# Patient Record
Sex: Female | Born: 1990 | Race: White | Hispanic: No | Marital: Single | Smoking: Never smoker
Health system: Southern US, Community
[De-identification: ages and names within clinical notes are randomized; demographics above are authoritative.]

## PROBLEM LIST (undated history)

## (undated) ENCOUNTER — Inpatient Hospital Stay (HOSPITAL_COMMUNITY): Payer: Self-pay

## (undated) DIAGNOSIS — F419 Anxiety disorder, unspecified: Secondary | ICD-10-CM

## (undated) DIAGNOSIS — N200 Calculus of kidney: Secondary | ICD-10-CM

## (undated) DIAGNOSIS — O139 Gestational [pregnancy-induced] hypertension without significant proteinuria, unspecified trimester: Secondary | ICD-10-CM

## (undated) DIAGNOSIS — D649 Anemia, unspecified: Secondary | ICD-10-CM

## (undated) DIAGNOSIS — N390 Urinary tract infection, site not specified: Secondary | ICD-10-CM

## (undated) HISTORY — DX: Urinary tract infection, site not specified: N39.0

---
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
## 2012-12-13 NOTE — L&D Delivery Note (Signed)
Delivery Note At 6:44 PM a viable female was delivered via Vaginal, Spontaneous Delivery (Presentation: ; Occiput Anterior).  APGAR: 6, 9; weight 9 lb 6.4 oz (4265 g).   Placenta status: Intact, Spontaneous.  Cord: 3 vessels with the following complications: None.     Anesthesia: Epidural  Episiotomy: None Lacerations: 2nd degree;Perineal Suture Repair: 3.0 vicryl Est. Blood Loss (mL): 300  Mom to postpartum.  Baby to nursery-stable.  Pt c/c/+2 and pushed to deliver a liveborn female via NSVD. Shoulder dystocia complicating delivery for <30 seconds that resolved with McRoberts and Suprapubic pressure. Spontaneous cry with toweling.  Placenta delivered spontaneously intact.  2nd degree perineal lac repaired without difficulty. Mom and baby stable.   Caleigha Zale L 08/10/2013, 8:11 PM

## 2013-01-09 LAB — OB RESULTS CONSOLE HIV ANTIBODY (ROUTINE TESTING): HIV: NONREACTIVE

## 2013-01-09 LAB — OB RESULTS CONSOLE GC/CHLAMYDIA
Chlamydia: NEGATIVE
Gonorrhea: NEGATIVE

## 2013-01-09 LAB — OB RESULTS CONSOLE RUBELLA ANTIBODY, IGM: Rubella: IMMUNE

## 2013-03-07 ENCOUNTER — Ambulatory Visit (INDEPENDENT_AMBULATORY_CARE_PROVIDER_SITE_OTHER): Payer: BC Managed Care – PPO | Admitting: Obstetrics and Gynecology

## 2013-03-07 ENCOUNTER — Encounter: Payer: Self-pay | Admitting: *Deleted

## 2013-03-07 ENCOUNTER — Encounter: Payer: Self-pay | Admitting: Obstetrics and Gynecology

## 2013-03-07 VITALS — BP 119/70 | Ht 69.5 in | Wt 181.0 lb

## 2013-03-07 DIAGNOSIS — O26899 Other specified pregnancy related conditions, unspecified trimester: Secondary | ICD-10-CM | POA: Insufficient documentation

## 2013-03-07 DIAGNOSIS — Z3400 Encounter for supervision of normal first pregnancy, unspecified trimester: Secondary | ICD-10-CM

## 2013-03-07 DIAGNOSIS — O360121 Maternal care for anti-D [Rh] antibodies, second trimester, fetus 1: Secondary | ICD-10-CM

## 2013-03-07 DIAGNOSIS — Z3402 Encounter for supervision of normal first pregnancy, second trimester: Secondary | ICD-10-CM | POA: Insufficient documentation

## 2013-03-07 DIAGNOSIS — O360990 Maternal care for other rhesus isoimmunization, unspecified trimester, not applicable or unspecified: Secondary | ICD-10-CM

## 2013-03-07 DIAGNOSIS — Z124 Encounter for screening for malignant neoplasm of cervix: Secondary | ICD-10-CM

## 2013-03-07 DIAGNOSIS — Z1151 Encounter for screening for human papillomavirus (HPV): Secondary | ICD-10-CM

## 2013-03-07 DIAGNOSIS — O2340 Unspecified infection of urinary tract in pregnancy, unspecified trimester: Secondary | ICD-10-CM | POA: Insufficient documentation

## 2013-03-07 NOTE — Addendum Note (Signed)
Addended by: Arne Cleveland on: 03/07/2013 04:59 PM   Modules accepted: Orders

## 2013-03-07 NOTE — Progress Notes (Signed)
P= 86  

## 2013-03-07 NOTE — Progress Notes (Signed)
Had [facility] in [place]. Records reviewed. Dating confirmed by early scan. Problem list, past medical history, Ob/Gyn history, surgical history, family history and social history reviewed and updated as appropriate. Discussed genetic counseling> declines.  Pap done. Fundus 2/3 to U. Discussed routines.

## 2013-03-07 NOTE — Patient Instructions (Signed)
Pregnancy - Second Trimester The second trimester of pregnancy (3 to 6 months) is a period of rapid growth for you and your baby. At the end of the sixth month, your baby is about 9 inches long and weighs 1 1/2 pounds. You will begin to feel the baby move between 18 and 20 weeks of the pregnancy. This is called quickening. Weight gain is faster. A clear fluid (colostrum) may leak out of your breasts. You may feel small contractions of the womb (uterus). This is known as false labor or Braxton-Hicks contractions. This is like a practice for labor when the baby is ready to be born. Usually, the problems with morning sickness have usually passed by the end of your first trimester. Some women develop small dark blotches (called cholasma, mask of pregnancy) on their face that usually goes away after the baby is born. Exposure to the sun makes the blotches worse. Acne may also develop in some pregnant women and pregnant women who have acne, may find that it goes away. PRENATAL EXAMS  Blood work may continue to be done during prenatal exams. These tests are done to check on your health and the probable health of your baby. Blood work is used to follow your blood levels (hemoglobin). Anemia (low hemoglobin) is common during pregnancy. Iron and vitamins are given to help prevent this. You will also be checked for diabetes between 24 and 28 weeks of the pregnancy. Some of the previous blood tests may be repeated.  The size of the uterus is measured during each visit. This is to make sure that the baby is continuing to grow properly according to the dates of the pregnancy.  Your blood pressure is checked every prenatal visit. This is to make sure you are not getting toxemia.  Your urine is checked to make sure you do not have an infection, diabetes or protein in the urine.  Your weight is checked often to make sure gains are happening at the suggested rate. This is to ensure that both you and your baby are growing  normally.  Sometimes, an ultrasound is performed to confirm the proper growth and development of the baby. This is a test which bounces harmless sound waves off the baby so your caregiver can more accurately determine due dates. Sometimes, a specialized test is done on the amniotic fluid surrounding the baby. This test is called an amniocentesis. The amniotic fluid is obtained by sticking a needle into the belly (abdomen). This is done to check the chromosomes in instances where there is a concern about possible genetic problems with the baby. It is also sometimes done near the end of pregnancy if an early delivery is required. In this case, it is done to help make sure the baby's lungs are mature enough for the baby to live outside of the womb. CHANGES OCCURING IN THE SECOND TRIMESTER OF PREGNANCY Your body goes through many changes during pregnancy. They vary from person to person. Talk to your caregiver about changes you notice that you are concerned about.  During the second trimester, you will likely have an increase in your appetite. It is normal to have cravings for certain foods. This varies from person to person and pregnancy to pregnancy.  Your lower abdomen will begin to bulge.  You may have to urinate more often because the uterus and baby are pressing on your bladder. It is also common to get more bladder infections during pregnancy (pain with urination). You can help this by   drinking lots of fluids and emptying your bladder before and after intercourse.  You may begin to get stretch marks on your hips, abdomen, and breasts. These are normal changes in the body during pregnancy. There are no exercises or medications to take that prevent this change.  You may begin to develop swollen and bulging veins (varicose veins) in your legs. Wearing support hose, elevating your feet for 15 minutes, 3 to 4 times a day and limiting salt in your diet helps lessen the problem.  Heartburn may develop  as the uterus grows and pushes up against the stomach. Antacids recommended by your caregiver helps with this problem. Also, eating smaller meals 4 to 5 times a day helps.  Constipation can be treated with a stool softener or adding bulk to your diet. Drinking lots of fluids, vegetables, fruits, and whole grains are helpful.  Exercising is also helpful. If you have been very active up until your pregnancy, most of these activities can be continued during your pregnancy. If you have been less active, it is helpful to start an exercise program such as walking.  Hemorrhoids (varicose veins in the rectum) may develop at the end of the second trimester. Warm sitz baths and hemorrhoid cream recommended by your caregiver helps hemorrhoid problems.  Backaches may develop during this time of your pregnancy. Avoid heavy lifting, wear low heal shoes and practice good posture to help with backache problems.  Some pregnant women develop tingling and numbness of their hand and fingers because of swelling and tightening of ligaments in the wrist (carpel tunnel syndrome). This goes away after the baby is born.  As your breasts enlarge, you may have to get a bigger bra. Get a comfortable, cotton, support bra. Do not get a nursing bra until the last month of the pregnancy if you will be nursing the baby.  You may get a dark line from your belly button to the pubic area called the linea nigra.  You may develop rosy cheeks because of increase blood flow to the face.  You may develop spider looking lines of the face, neck, arms and chest. These go away after the baby is born. HOME CARE INSTRUCTIONS   It is extremely important to avoid all smoking, herbs, alcohol, and unprescribed drugs during your pregnancy. These chemicals affect the formation and growth of the baby. Avoid these chemicals throughout the pregnancy to ensure the delivery of a healthy infant.  Most of your home care instructions are the same as  suggested for the first trimester of your pregnancy. Keep your caregiver's appointments. Follow your caregiver's instructions regarding medication use, exercise and diet.  During pregnancy, you are providing food for you and your baby. Continue to eat regular, well-balanced meals. Choose foods such as meat, fish, milk and other low fat dairy products, vegetables, fruits, and whole-grain breads and cereals. Your caregiver will tell you of the ideal weight gain.  A physical sexual relationship may be continued up until near the end of pregnancy if there are no other problems. Problems could include early (premature) leaking of amniotic fluid from the membranes, vaginal bleeding, abdominal pain, or other medical or pregnancy problems.  Exercise regularly if there are no restrictions. Check with your caregiver if you are unsure of the safety of some of your exercises. The greatest weight gain will occur in the last 2 trimesters of pregnancy. Exercise will help you:  Control your weight.  Get you in shape for labor and delivery.  Lose weight   after you have the baby.  Wear a good support or jogging bra for breast tenderness during pregnancy. This may help if worn during sleep. Pads or tissues may be used in the bra if you are leaking colostrum.  Do not use hot tubs, steam rooms or saunas throughout the pregnancy.  Wear your seat belt at all times when driving. This protects you and your baby if you are in an accident.  Avoid raw meat, uncooked cheese, cat litter boxes and soil used by cats. These carry germs that can cause birth defects in the baby.  The second trimester is also a good time to visit your dentist for your dental health if this has not been done yet. Getting your teeth cleaned is OK. Use a soft toothbrush. Brush gently during pregnancy.  It is easier to loose urine during pregnancy. Tightening up and strengthening the pelvic muscles will help with this problem. Practice stopping your  urination while you are going to the bathroom. These are the same muscles you need to strengthen. It is also the muscles you would use as if you were trying to stop from passing gas. You can practice tightening these muscles up 10 times a set and repeating this about 3 times per day. Once you know what muscles to tighten up, do not perform these exercises during urination. It is more likely to contribute to an infection by backing up the urine.  Ask for help if you have financial, counseling or nutritional needs during pregnancy. Your caregiver will be able to offer counseling for these needs as well as refer you for other special needs.  Your skin may become oily. If so, wash your face with mild soap, use non-greasy moisturizer and oil or cream based makeup. MEDICATIONS AND DRUG USE IN PREGNANCY  Take prenatal vitamins as directed. The vitamin should contain 1 milligram of folic acid. Keep all vitamins out of reach of children. Only a couple vitamins or tablets containing iron may be fatal to a baby or young child when ingested.  Avoid use of all medications, including herbs, over-the-counter medications, not prescribed or suggested by your caregiver. Only take over-the-counter or prescription medicines for pain, discomfort, or fever as directed by your caregiver. Do not use aspirin.  Let your caregiver also know about herbs you may be using.  Alcohol is related to a number of birth defects. This includes fetal alcohol syndrome. All alcohol, in any form, should be avoided completely. Smoking will cause low birth rate and premature babies.  Street or illegal drugs are very harmful to the baby. They are absolutely forbidden. A baby born to an addicted mother will be addicted at birth. The baby will go through the same withdrawal an adult does. SEEK MEDICAL CARE IF:  You have any concerns or worries during your pregnancy. It is better to call with your questions if you feel they cannot wait, rather  than worry about them. SEEK IMMEDIATE MEDICAL CARE IF:   An unexplained oral temperature above 102 F (38.9 C) develops, or as your caregiver suggests.  You have leaking of fluid from the vagina (birth canal). If leaking membranes are suspected, take your temperature and tell your caregiver of this when you call.  There is vaginal spotting, bleeding, or passing clots. Tell your caregiver of the amount and how many pads are used. Light spotting in pregnancy is common, especially following intercourse.  You develop a bad smelling vaginal discharge with a change in the color from clear   to white.  You continue to feel sick to your stomach (nauseated) and have no relief from remedies suggested. You vomit blood or coffee ground-like materials.  You lose more than 2 pounds of weight or gain more than 2 pounds of weight over 1 week, or as suggested by your caregiver.  You notice swelling of your face, hands, feet, or legs.  You get exposed to German measles and have never had them.  You are exposed to fifth disease or chickenpox.  You develop belly (abdominal) pain. Round ligament discomfort is a common non-cancerous (benign) cause of abdominal pain in pregnancy. Your caregiver still must evaluate you.  You develop a bad headache that does not go away.  You develop fever, diarrhea, pain with urination, or shortness of breath.  You develop visual problems, blurry, or double vision.  You fall or are in a car accident or any kind of trauma.  There is mental or physical violence at home. Document Released: 11/23/2001 Document Revised: 02/21/2012 Document Reviewed: 05/28/2009 [employer] Patient Information 2013 [employer].  

## 2013-03-30 ENCOUNTER — Ambulatory Visit (HOSPITAL_COMMUNITY)
Admission: RE | Admit: 2013-03-30 | Discharge: 2013-03-30 | Disposition: A | Discharge status: Home / Self Care | Payer: BC Managed Care – PPO | Source: Ambulatory Visit | Attending: Obstetrics and Gynecology | Admitting: Obstetrics and Gynecology

## 2013-03-30 DIAGNOSIS — Z3402 Encounter for supervision of normal first pregnancy, second trimester: Secondary | ICD-10-CM

## 2013-03-30 DIAGNOSIS — Z3689 Encounter for other specified antenatal screening: Secondary | ICD-10-CM | POA: Insufficient documentation

## 2013-03-30 DIAGNOSIS — O360121 Maternal care for anti-D [Rh] antibodies, second trimester, fetus 1: Secondary | ICD-10-CM

## 2013-04-01 DIAGNOSIS — O4440 Low lying placenta NOS or without hemorrhage, unspecified trimester: Secondary | ICD-10-CM | POA: Insufficient documentation

## 2013-04-02 ENCOUNTER — Encounter: Payer: Self-pay | Admitting: Advanced Practice Midwife

## 2013-04-09 ENCOUNTER — Telehealth: Payer: Self-pay | Admitting: *Deleted

## 2013-04-09 ENCOUNTER — Encounter: Payer: Self-pay | Admitting: Advanced Practice Midwife

## 2013-04-09 NOTE — Telephone Encounter (Signed)
Lm on voicemail to try heat and tylenol for her back, drink plenty of fluids for the dizziness as this could be a sign of dehydration.  Offered to see pt tomorrow if she wanted to be seen before Wed, when she has an appt scheduled.  Explained that we could not call anything in without seeing her as we don't know what we are treating.

## 2013-04-11 ENCOUNTER — Ambulatory Visit (INDEPENDENT_AMBULATORY_CARE_PROVIDER_SITE_OTHER): Payer: BC Managed Care – PPO | Admitting: Advanced Practice Midwife

## 2013-04-11 VITALS — BP 126/72 | Wt 193.0 lb

## 2013-04-11 DIAGNOSIS — Z3402 Encounter for supervision of normal first pregnancy, second trimester: Secondary | ICD-10-CM

## 2013-04-11 DIAGNOSIS — Z3400 Encounter for supervision of normal first pregnancy, unspecified trimester: Secondary | ICD-10-CM

## 2013-04-11 DIAGNOSIS — R300 Dysuria: Secondary | ICD-10-CM

## 2013-04-11 LAB — POCT URINALYSIS DIPSTICK
Bilirubin, UA: NEGATIVE
Ketones, UA: NEGATIVE
pH, UA: 5

## 2013-04-11 MED ORDER — CYCLOBENZAPRINE HCL 10 MG PO TABS: 10.0000 mg | ORAL_TABLET | Freq: Three times a day (TID) | ORAL | Status: DC | PRN

## 2013-04-11 MED ORDER — CEPHALEXIN 500 MG PO CAPS: 500.0000 mg | ORAL_CAPSULE | Freq: Three times a day (TID) | ORAL | Status: DC

## 2013-04-11 NOTE — Progress Notes (Signed)
P - 104 - Pt states her lower back pain is unbearable during the night.

## 2013-04-11 NOTE — Progress Notes (Signed)
Pt states she is having muscle spasms nightly in low back, tried heating pad and tylenol, no relief. Discussed positioning, using extra pillows, rx flexeril, rev'd back stretches. Low lying placenta on anatomy scan - pelvic rest, repeat u/s at 28 weeks, rev'd bleeding precautions.

## 2013-04-11 NOTE — Progress Notes (Signed)
Moderate blood and leuk on urine dip today  -keflex rx for UTI, culture sent

## 2013-04-15 LAB — CULTURE, URINE COMPREHENSIVE

## 2013-05-09 ENCOUNTER — Encounter: Payer: Self-pay | Admitting: Advanced Practice Midwife

## 2013-05-09 ENCOUNTER — Ambulatory Visit (INDEPENDENT_AMBULATORY_CARE_PROVIDER_SITE_OTHER): Payer: BC Managed Care – PPO | Admitting: Advanced Practice Midwife

## 2013-05-09 VITALS — BP 132/79 | Wt 203.0 lb

## 2013-05-09 DIAGNOSIS — M5380 Other specified dorsopathies, site unspecified: Secondary | ICD-10-CM

## 2013-05-09 DIAGNOSIS — O4410 Placenta previa with hemorrhage, unspecified trimester: Secondary | ICD-10-CM

## 2013-05-09 DIAGNOSIS — Z3480 Encounter for supervision of other normal pregnancy, unspecified trimester: Secondary | ICD-10-CM

## 2013-05-09 DIAGNOSIS — O4442 Low lying placenta NOS or without hemorrhage, second trimester: Secondary | ICD-10-CM

## 2013-05-09 DIAGNOSIS — M62830 Muscle spasm of back: Secondary | ICD-10-CM | POA: Insufficient documentation

## 2013-05-09 MED ORDER — OXYCODONE-ACETAMINOPHEN 5-325 MG PO TABS: 1.0000 | ORAL_TABLET | ORAL | Status: DC | PRN

## 2013-05-09 NOTE — Progress Notes (Signed)
Back pain persistent and worse, not  Helped by Flexeril Will give small Rx Percocet and refer to PT next door.  Decreased perception of Fetal Movement for a month. Audible frequent movement on monitor. Reassured

## 2013-05-09 NOTE — Patient Instructions (Signed)

## 2013-05-09 NOTE — Progress Notes (Signed)
P - 103 - Pt states movement has decreased to twice per day - Pt states back pain is still bad but she finished abx and flexerill causes fatigue

## 2013-05-10 ENCOUNTER — Telehealth: Payer: Self-pay

## 2013-05-10 NOTE — Telephone Encounter (Signed)
Pt. Would like to be referred to [facility] center in [place] [phone] instead of [facility] because her mother in law works there. Faxed over referral to [facility] rehab with notes to [phone].

## 2013-05-24 ENCOUNTER — Other Ambulatory Visit: Payer: Self-pay | Admitting: Advanced Practice Midwife

## 2013-05-30 ENCOUNTER — Ambulatory Visit (HOSPITAL_COMMUNITY)
Admission: RE | Admit: 2013-05-30 | Discharge: 2013-05-30 | Disposition: A | Discharge status: Home / Self Care | Payer: BC Managed Care – PPO | Source: Ambulatory Visit | Attending: Advanced Practice Midwife | Admitting: Advanced Practice Midwife

## 2013-05-30 DIAGNOSIS — O4442 Low lying placenta NOS or without hemorrhage, second trimester: Secondary | ICD-10-CM

## 2013-05-30 DIAGNOSIS — O4410 Placenta previa with hemorrhage, unspecified trimester: Secondary | ICD-10-CM | POA: Insufficient documentation

## 2013-05-30 DIAGNOSIS — Z3689 Encounter for other specified antenatal screening: Secondary | ICD-10-CM | POA: Insufficient documentation

## 2013-06-01 ENCOUNTER — Encounter: Payer: Self-pay | Admitting: Advanced Practice Midwife

## 2013-06-04 ENCOUNTER — Encounter: Payer: Self-pay | Admitting: *Deleted

## 2013-06-04 ENCOUNTER — Encounter: Payer: Self-pay | Admitting: Advanced Practice Midwife

## 2013-06-04 ENCOUNTER — Ambulatory Visit (INDEPENDENT_AMBULATORY_CARE_PROVIDER_SITE_OTHER): Payer: BC Managed Care – PPO | Admitting: Advanced Practice Midwife

## 2013-06-04 ENCOUNTER — Ambulatory Visit: Payer: Self-pay | Admitting: *Deleted

## 2013-06-04 VITALS — BP 127/73 | Wt 210.0 lb

## 2013-06-04 DIAGNOSIS — Z3492 Encounter for supervision of normal pregnancy, unspecified, second trimester: Secondary | ICD-10-CM

## 2013-06-04 DIAGNOSIS — Z23 Encounter for immunization: Secondary | ICD-10-CM

## 2013-06-04 DIAGNOSIS — O360121 Maternal care for anti-D [Rh] antibodies, second trimester, fetus 1: Secondary | ICD-10-CM

## 2013-06-04 DIAGNOSIS — Z3480 Encounter for supervision of other normal pregnancy, unspecified trimester: Secondary | ICD-10-CM

## 2013-06-04 DIAGNOSIS — O360990 Maternal care for other rhesus isoimmunization, unspecified trimester, not applicable or unspecified: Secondary | ICD-10-CM

## 2013-06-04 LAB — CBC
HCT: 31.5 % — ABNORMAL LOW (ref 36.0–46.0)
Platelets: 244 10*3/uL (ref 150–400)
RDW: 13.2 % (ref 11.5–15.5)
WBC: 8.2 10*3/uL (ref 4.0–10.5)

## 2013-06-04 MED ORDER — RHO D IMMUNE GLOBULIN 1500 UNIT/2ML IJ SOLN
300.0000 ug | Freq: Once | INTRAMUSCULAR | Status: AC
  Administered 2013-06-04: 300 ug via INTRAMUSCULAR

## 2013-06-04 NOTE — Progress Notes (Signed)
Still has some back spasm, but gets hangover from Flexeril. Advised to halve dose. PT is helping, has appt today.

## 2013-06-04 NOTE — Patient Instructions (Addendum)

## 2013-06-04 NOTE — Progress Notes (Signed)
P = 92 

## 2013-06-05 ENCOUNTER — Telehealth: Payer: Self-pay | Admitting: *Deleted

## 2013-06-05 LAB — HIV ANTIBODY (ROUTINE TESTING W REFLEX): HIV: NONREACTIVE

## 2013-06-05 NOTE — Telephone Encounter (Signed)
LM on cell phone that her 28 week labs were normal.

## 2013-06-10 ENCOUNTER — Inpatient Hospital Stay (HOSPITAL_COMMUNITY)
Admission: AD | Admit: 2013-06-10 | Discharge: 2013-06-10 | Disposition: A | Discharge status: Home / Self Care | Payer: BC Managed Care – PPO | Source: Ambulatory Visit | Attending: Obstetrics & Gynecology | Admitting: Obstetrics & Gynecology

## 2013-06-10 ENCOUNTER — Encounter (HOSPITAL_COMMUNITY): Payer: Self-pay | Admitting: Obstetrics and Gynecology

## 2013-06-10 DIAGNOSIS — O4700 False labor before 37 completed weeks of gestation, unspecified trimester: Secondary | ICD-10-CM | POA: Insufficient documentation

## 2013-06-10 DIAGNOSIS — M5380 Other specified dorsopathies, site unspecified: Secondary | ICD-10-CM

## 2013-06-10 DIAGNOSIS — M62830 Muscle spasm of back: Secondary | ICD-10-CM

## 2013-06-10 DIAGNOSIS — O26899 Other specified pregnancy related conditions, unspecified trimester: Secondary | ICD-10-CM

## 2013-06-10 DIAGNOSIS — O99891 Other specified diseases and conditions complicating pregnancy: Secondary | ICD-10-CM | POA: Insufficient documentation

## 2013-06-10 DIAGNOSIS — N949 Unspecified condition associated with female genital organs and menstrual cycle: Secondary | ICD-10-CM | POA: Insufficient documentation

## 2013-06-10 DIAGNOSIS — R109 Unspecified abdominal pain: Secondary | ICD-10-CM | POA: Insufficient documentation

## 2013-06-10 LAB — URINE MICROSCOPIC-ADD ON

## 2013-06-10 LAB — WET PREP, GENITAL

## 2013-06-10 LAB — URINALYSIS, ROUTINE W REFLEX MICROSCOPIC
Glucose, UA: NEGATIVE mg/dL
Hgb urine dipstick: NEGATIVE
Protein, ur: NEGATIVE mg/dL
Specific Gravity, Urine: 1.01 (ref 1.005–1.030)
Urobilinogen, UA: 0.2 mg/dL (ref 0.0–1.0)

## 2013-06-10 MED ORDER — FLUCONAZOLE 150 MG PO TABS: 150.0000 mg | ORAL_TABLET | Freq: Once | ORAL | Status: DC

## 2013-06-10 MED ORDER — FLUCONAZOLE 150 MG PO TABS
150.0000 mg | ORAL_TABLET | Freq: Once | ORAL | Status: AC
  Administered 2013-06-10: 150 mg via ORAL
  Filled 2013-06-10: qty 1

## 2013-06-10 NOTE — MAU Note (Signed)
Pt reports having ctx/cramping on and off all morning. Called provider and was told to come in. Seen in [place] [facility]. Reprots having heavy yellow/whit vag discharge. denies bleeding. Good fetal movement reported

## 2013-06-10 NOTE — MAU Provider Note (Signed)
History     CSN: [id number]  Arrival date and time: 06/10/13 1427   First Provider Initiated Contact with Patient 06/10/13 1538      Chief Complaint  Patient presents with  . Contractions   HPI Kylie Richards is a 22 y.o. G1P0000 at [redacted]w[redacted]d who presents with lower abdominal cramping which began this morning around 9:15am. Patient states that she has never had this pain and was worried so she presented for further evaluation.  Patient reports that her abdominal pain began at ~9:15 this am.  Described as crampy and mild in severity.  No associated vaginal bleeding or loss of fluid.  No reported contractions.   Patient receives her [facility] at [facility] - [facility].  Pregnancy has been complicated by placenta previa which has no resolved on recent US.  Past Medical History  Diagnosis Date  . Recurrent UTI     Past Surgical History  Procedure Laterality Date  . Cholecystectomy  12/13/1996    Rupture    Family History  Problem Relation Age of Onset  . Diabetes Mother   . Obesity Mother   . Drug abuse Mother   . Depression Mother   . Anxiety disorder Mother   . Hypertension Mother   . Sleep apnea Father   . Hypertension Maternal Grandmother   . Diabetes Maternal Grandmother   . Thyroid cancer Maternal Grandmother   . Heart disease Maternal Grandmother   . Kidney disease Sister   . Urinary tract infection Sister     recurrent  . Cervical cancer Maternal Aunt   . Cervical cancer Paternal Aunt     History  Substance Use Topics  . Smoking status: Never Smoker   . Smokeless tobacco: Never Used  . Alcohol Use: No    Allergies:  Allergies  Allergen Reactions  . Sulfa Antibiotics Other (See Comments)    Unknown childhood rxn    Prescriptions prior to admission  Medication Sig Dispense Refill  . diphenhydrAMINE (BENADRYL) 25 MG tablet Take 25 mg by mouth every 6 (six) hours as needed for allergies.      . Prenatal Vit-Fe Fumarate-FA (PRENATAL MULTIVITAMIN) TABS  Take 1 tablet by mouth every morning.        ROS Per HPI with the following additions: Patient endorses vaginal discharge - no odor and no associated itching/burning.  Physical Exam   Blood pressure 122/69, pulse 91, temperature 98.2 F (36.8 C), temperature source Oral, resp. rate 18, height 5\' 10"  (1.778 m), weight 96.163 kg (212 lb), last menstrual period 11/12/2012.  Physical Exam Gen: well appearing, NAD. Heart: RRR.  Lungs: CTAB.  Abd: gravid but otherwise soft, nontender to palpation Ext: no appreciable lower extremity edema bilaterally Neuro: no focal deficits. GU: normal appearing external genitalia Cervical exam:  Dilation: Closed Effacement (%): Thick Exam by:: Dr. Adriana Simas   FHR: baseline 140, mod variability,  accels present, decels absent.  Toco: Occasional UC noted (although difficult to discern).   MAU Course  Procedures  MDM FFN, Wet prep, GC/Chlamydia  Assessment and Plan  Kylie Richards is a 22 y.o. G1P0000 at [redacted]w[redacted]d who presents with lower abdominal cramping and vaginal discharge.  - Obtaining FFN, Wet prep and GC/Chlamydia - Cervix closed.   1630 - FFN negative.  Wet prep revealed few yeast.  Will give Diflucan and discharge home with labor precautions.   Everlene Other 06/10/2013, 3:47 PM   I saw and examined patient and agree with above resident note. I reviewed history, imaging, labs, and  vitals. I personally reviewed the fetal heart tracing, and it is reactive. Napoleon Form, MD

## 2013-06-11 LAB — GC/CHLAMYDIA PROBE AMP
CT Probe RNA: NEGATIVE
GC Probe RNA: NEGATIVE

## 2013-06-19 ENCOUNTER — Encounter: Payer: BC Managed Care – PPO | Admitting: Obstetrics & Gynecology

## 2013-06-22 ENCOUNTER — Ambulatory Visit (INDEPENDENT_AMBULATORY_CARE_PROVIDER_SITE_OTHER): Payer: BC Managed Care – PPO | Admitting: Family

## 2013-06-22 VITALS — BP 118/69 | Wt 217.0 lb

## 2013-06-22 DIAGNOSIS — Z3400 Encounter for supervision of normal first pregnancy, unspecified trimester: Secondary | ICD-10-CM

## 2013-06-22 DIAGNOSIS — Z3402 Encounter for supervision of normal first pregnancy, second trimester: Secondary | ICD-10-CM

## 2013-06-22 DIAGNOSIS — O360990 Maternal care for other rhesus isoimmunization, unspecified trimester, not applicable or unspecified: Secondary | ICD-10-CM

## 2013-06-22 DIAGNOSIS — O360121 Maternal care for anti-D [Rh] antibodies, second trimester, fetus 1: Secondary | ICD-10-CM

## 2013-06-22 NOTE — Progress Notes (Signed)
No questions or concerns; desires midwife at delivery.  Explained fundal heights.

## 2013-06-22 NOTE — Progress Notes (Signed)
p-92 

## 2013-07-06 ENCOUNTER — Ambulatory Visit: Payer: BC Managed Care – PPO | Admitting: Advanced Practice Midwife

## 2013-07-06 VITALS — BP 119/70 | Wt 223.0 lb

## 2013-07-06 NOTE — Progress Notes (Signed)
p=95 

## 2013-07-09 ENCOUNTER — Ambulatory Visit (INDEPENDENT_AMBULATORY_CARE_PROVIDER_SITE_OTHER): Payer: BC Managed Care – PPO | Admitting: Advanced Practice Midwife

## 2013-07-09 VITALS — BP 124/75 | Wt 223.0 lb

## 2013-07-09 DIAGNOSIS — O479 False labor, unspecified: Secondary | ICD-10-CM

## 2013-07-09 DIAGNOSIS — Z3480 Encounter for supervision of other normal pregnancy, unspecified trimester: Secondary | ICD-10-CM

## 2013-07-09 MED ORDER — NIFEDIPINE 10 MG PO CAPS: 10.0000 mg | ORAL_CAPSULE | Freq: Four times a day (QID) | ORAL | Status: DC | PRN

## 2013-07-09 NOTE — Progress Notes (Signed)
p=101 

## 2013-07-09 NOTE — Patient Instructions (Signed)

## 2013-07-09 NOTE — Progress Notes (Signed)
Lots of BH contractions this week, mostly painful like menstrual cramps, about 4/hour. Cervix effacing. Will use some Procardia to slow UCs and help her get some rest, since cervix is changing some. PTL precautions.

## 2013-07-16 ENCOUNTER — Ambulatory Visit (INDEPENDENT_AMBULATORY_CARE_PROVIDER_SITE_OTHER): Payer: BC Managed Care – PPO | Admitting: Family

## 2013-07-16 VITALS — BP 121/85 | Wt 224.0 lb

## 2013-07-16 DIAGNOSIS — Z3480 Encounter for supervision of other normal pregnancy, unspecified trimester: Secondary | ICD-10-CM

## 2013-07-16 DIAGNOSIS — O479 False labor, unspecified: Secondary | ICD-10-CM

## 2013-07-16 NOTE — Progress Notes (Signed)
p=102 

## 2013-07-16 NOTE — Progress Notes (Signed)
Irregular contractions; no leaking or bleeding.  Cervix - unchanged, fingertip; reviewed PTL precautions.

## 2013-07-19 ENCOUNTER — Ambulatory Visit (INDEPENDENT_AMBULATORY_CARE_PROVIDER_SITE_OTHER): Payer: BC Managed Care – PPO | Admitting: Obstetrics & Gynecology

## 2013-07-19 VITALS — BP 134/82

## 2013-07-19 DIAGNOSIS — O368190 Decreased fetal movements, unspecified trimester, not applicable or unspecified: Secondary | ICD-10-CM

## 2013-07-19 DIAGNOSIS — Z3402 Encounter for supervision of normal first pregnancy, second trimester: Secondary | ICD-10-CM

## 2013-07-19 DIAGNOSIS — Z3400 Encounter for supervision of normal first pregnancy, unspecified trimester: Secondary | ICD-10-CM

## 2013-07-19 NOTE — Progress Notes (Signed)
She came from work today with ? ROM this morning. She had sex last night for the first time in about 6 weeks and woke up with liquid running down her leg. While sitting on the table here in the office, I noted that the paper on the table was dry. Negative pool, Valsalva, Nitrazine, and fern. Reassurance given. Labor precautions reviewed. Check cultures at next visit. She reports decreased FM today. NST- reactive. She requested a vaginal exam. Her cervix is closed.

## 2013-07-19 NOTE — Progress Notes (Signed)
p-100  Woke up this morning and thinks her water has broken and still leaking

## 2013-07-23 ENCOUNTER — Ambulatory Visit (INDEPENDENT_AMBULATORY_CARE_PROVIDER_SITE_OTHER): Payer: BC Managed Care – PPO | Admitting: Advanced Practice Midwife

## 2013-07-23 VITALS — BP 151/82 | Temp 97.4°F | Wt 227.0 lb

## 2013-07-23 DIAGNOSIS — Z3403 Encounter for supervision of normal first pregnancy, third trimester: Secondary | ICD-10-CM

## 2013-07-23 DIAGNOSIS — Z3400 Encounter for supervision of normal first pregnancy, unspecified trimester: Secondary | ICD-10-CM

## 2013-07-23 LAB — COMPREHENSIVE METABOLIC PANEL
ALT: 9 U/L (ref 0–35)
AST: 13 U/L (ref 0–37)
Albumin: 3 g/dL — ABNORMAL LOW (ref 3.5–5.2)
CO2: 24 mEq/L (ref 19–32)
Calcium: 9.2 mg/dL (ref 8.4–10.5)
Chloride: 106 mEq/L (ref 96–112)
Creat: 0.54 mg/dL (ref 0.50–1.10)
Potassium: 4.2 mEq/L (ref 3.5–5.3)
Sodium: 138 mEq/L (ref 135–145)
Total Protein: 5.7 g/dL — ABNORMAL LOW (ref 6.0–8.3)

## 2013-07-23 LAB — CBC
HCT: 30.9 % — ABNORMAL LOW (ref 36.0–46.0)
Hemoglobin: 10.2 g/dL — ABNORMAL LOW (ref 12.0–15.0)
MCH: 27.3 pg (ref 26.0–34.0)
MCHC: 33 g/dL (ref 30.0–36.0)

## 2013-07-23 NOTE — Progress Notes (Signed)
Went to [facility] on Sat for sinus infection    p-122  BP recheck 128/81  PIH labs drawn today

## 2013-07-23 NOTE — Progress Notes (Signed)
Doing well.  Good fetal movement, denies vaginal bleeding, LOF, regular contractions.  Reviewed signs of labor, reasons to come to hospital.  Reports mild h/a since Saturday, started with onset of sinus infection.  Denies epigastric pain or visual changes.  PIH labs drawn today. Retake of BP normal--see RN note.

## 2013-07-24 LAB — GC/CHLAMYDIA PROBE AMP, URINE: Chlamydia, Swab/Urine, PCR: NEGATIVE

## 2013-07-24 LAB — PROTEIN / CREATININE RATIO, URINE: Creatinine, Urine: 81.2 mg/dL

## 2013-07-26 LAB — OB RESULTS CONSOLE GBS: GBS: NEGATIVE

## 2013-07-27 ENCOUNTER — Encounter (HOSPITAL_COMMUNITY): Payer: Self-pay | Admitting: *Deleted

## 2013-07-27 ENCOUNTER — Inpatient Hospital Stay (HOSPITAL_COMMUNITY)
Admission: AD | Admit: 2013-07-27 | Discharge: 2013-07-27 | Disposition: A | Discharge status: Home / Self Care | Payer: BC Managed Care – PPO | Source: Ambulatory Visit | Attending: Obstetrics & Gynecology | Admitting: Obstetrics & Gynecology

## 2013-07-27 DIAGNOSIS — O368190 Decreased fetal movements, unspecified trimester, not applicable or unspecified: Secondary | ICD-10-CM

## 2013-07-27 DIAGNOSIS — J069 Acute upper respiratory infection, unspecified: Secondary | ICD-10-CM

## 2013-07-27 DIAGNOSIS — M62830 Muscle spasm of back: Secondary | ICD-10-CM

## 2013-07-27 DIAGNOSIS — O99891 Other specified diseases and conditions complicating pregnancy: Secondary | ICD-10-CM

## 2013-07-27 HISTORY — DX: Anemia, unspecified: D64.9

## 2013-07-27 NOTE — MAU Provider Note (Signed)
History     CSN: [id number]  Arrival date and time: 07/27/13 1703   None     Chief Complaint  Patient presents with  . Cough  . Fever  . Decreased Fetal Movement   Cough Associated symptoms include a fever.  Fever  Associated symptoms include coughing.   Kylie Richards is a 22 y.o. G1P0000 at [redacted]w[redacted]d presents for evaluation of upper respiratory symptoms. Patient states for last 10 days she's had upper respiratory congestion and dullness in her right year. Patient reports that she's also had low-grade temperature at home less than 100. Reports that she had sore throat and swallowing phlegm. Patient denies any shortness of breath, nausea, vomiting, headache, fevers, chills, diarrhea, constipation, urinary symptoms otherwise feels feeling well   OB History   Grav Para Term Preterm Abortions TAB SAB Ect Mult Living   1 0 0 0 0 0 0 0 0 0       Past Medical History  Diagnosis Date  . Recurrent UTI   . Anemia     Past Surgical History  Procedure Laterality Date  . Cholecystectomy  12/13/1996    Rupture    Family History  Problem Relation Age of Onset  . Diabetes Mother   . Obesity Mother   . Drug abuse Mother   . Depression Mother   . Anxiety disorder Mother   . Hypertension Mother   . Sleep apnea Father   . Hypertension Maternal Grandmother   . Diabetes Maternal Grandmother   . Thyroid cancer Maternal Grandmother   . Heart disease Maternal Grandmother   . Kidney disease Sister   . Urinary tract infection Sister     recurrent  . Cervical cancer Maternal Aunt   . Cervical cancer Paternal Aunt     History  Substance Use Topics  . Smoking status: Never Smoker   . Smokeless tobacco: Never Used  . Alcohol Use: No    Allergies:  Allergies  Allergen Reactions  . Sulfa Antibiotics Other (See Comments)    Unknown childhood rxn    Prescriptions prior to admission  Medication Sig Dispense Refill  . amoxicillin (AMOXIL) 500 MG capsule Take 500 mg by mouth 2  (two) times daily. X 7 days. Started on 07/22/2013      . ferrous gluconate (FERGON) 325 MG tablet Take 325 mg by mouth 2 (two) times daily.      . Prenatal Vit-Fe Fumarate-FA (PRENATAL MULTIVITAMIN) TABS Take 1 tablet by mouth every morning.        Review of Systems  Constitutional: Positive for fever.  Respiratory: Positive for cough.    Physical Exam   Blood pressure 131/89, pulse 113, temperature 98 F (36.7 C), temperature source Oral, resp. rate 19, height 5\' 9"  (1.753 m), weight 105.688 kg (233 lb), last menstrual period 11/12/2012, SpO2 100.00%.  Physical Exam  Constitutional: She appears well-developed and well-nourished. No distress.  HENT:  Head: Normocephalic and atraumatic.  Right Ear: External ear normal.  Left Ear: External ear normal.  Nose: Nose normal.  Mouth/Throat: Uvula is midline and oropharynx is clear and moist. Mucous membranes are not pale, not dry and not cyanotic. No oropharyngeal exudate, posterior oropharyngeal edema, posterior oropharyngeal erythema or tonsillar abscesses.  Right ear with significant ear wax unable to visualize tympanic membrane No sinus tenderness  Eyes: Conjunctivae and EOM are normal. Pupils are equal, round, and reactive to light. Right eye exhibits no discharge. Left eye exhibits no discharge. No scleral icterus.  Neck: Normal range of  motion. Neck supple. No tracheal deviation present. No thyromegaly present.  GI: Soft. She exhibits no distension and no mass. There is no tenderness. There is no rebound and no guarding.  Lymphadenopathy:    She has no cervical adenopathy.  Skin: She is not diaphoretic.   FHT: 140s mod var, no decel + accels MAU Course  Procedures   Assessment and Plan  Kylie Richards is a 22 y.o. G1P0000 at [redacted]w[redacted]d with upper respiratory infection. Patient likely has a viral infection. Currently afebrile and otherwise well appearing. Patient may use over-the-counter medications for cold. Including antihistamines,  Afrin for 3 days, sinus rinse kit. Patient states understanding that this is a viral etiology will resolve on its own. Patient returned abortions for fever, inability to by mouth, worsening symptoms or pain. Also given term labor precautions.  Tawana Scale 07/27/2013, 7:15 PM

## 2013-07-27 NOTE — MAU Note (Signed)
Patient states she was recently seen at [facility] for a cold and was put on Amoxicillin. States she is not feeling better and continues to have a fever every night up to 101, during the day no fever. Has cough and congestion. Has mild contractions every night, none now. No bleeding or leaking. Patient reports fetal movement but less than usual.

## 2013-07-30 ENCOUNTER — Encounter: Payer: Self-pay | Admitting: Advanced Practice Midwife

## 2013-07-30 ENCOUNTER — Encounter (HOSPITAL_COMMUNITY): Payer: Self-pay | Admitting: *Deleted

## 2013-07-30 ENCOUNTER — Ambulatory Visit (INDEPENDENT_AMBULATORY_CARE_PROVIDER_SITE_OTHER): Payer: BC Managed Care – PPO | Admitting: Advanced Practice Midwife

## 2013-07-30 ENCOUNTER — Inpatient Hospital Stay (HOSPITAL_COMMUNITY)
Admission: AD | Admit: 2013-07-30 | Discharge: 2013-07-30 | Disposition: A | Discharge status: Home / Self Care | Payer: BC Managed Care – PPO | Source: Ambulatory Visit | Attending: Obstetrics and Gynecology | Admitting: Obstetrics and Gynecology

## 2013-07-30 VITALS — BP 145/84 | Wt 231.0 lb

## 2013-07-30 DIAGNOSIS — N898 Other specified noninflammatory disorders of vagina: Secondary | ICD-10-CM

## 2013-07-30 DIAGNOSIS — O9989 Other specified diseases and conditions complicating pregnancy, childbirth and the puerperium: Secondary | ICD-10-CM

## 2013-07-30 DIAGNOSIS — Z3480 Encounter for supervision of other normal pregnancy, unspecified trimester: Secondary | ICD-10-CM

## 2013-07-30 DIAGNOSIS — O163 Unspecified maternal hypertension, third trimester: Secondary | ICD-10-CM

## 2013-07-30 DIAGNOSIS — Z3402 Encounter for supervision of normal first pregnancy, second trimester: Secondary | ICD-10-CM

## 2013-07-30 DIAGNOSIS — M62830 Muscle spasm of back: Secondary | ICD-10-CM

## 2013-07-30 DIAGNOSIS — R300 Dysuria: Secondary | ICD-10-CM

## 2013-07-30 DIAGNOSIS — O139 Gestational [pregnancy-induced] hypertension without significant proteinuria, unspecified trimester: Secondary | ICD-10-CM

## 2013-07-30 DIAGNOSIS — O26893 Other specified pregnancy related conditions, third trimester: Secondary | ICD-10-CM

## 2013-07-30 DIAGNOSIS — M549 Dorsalgia, unspecified: Secondary | ICD-10-CM

## 2013-07-30 LAB — POCT URINALYSIS DIPSTICK
Bilirubin, UA: NEGATIVE
Blood, UA: NEGATIVE
Ketones, UA: NEGATIVE
Spec Grav, UA: 1.015
pH, UA: 7

## 2013-07-30 LAB — COMPREHENSIVE METABOLIC PANEL
ALT: 9 U/L (ref 0–35)
Alkaline Phosphatase: 199 U/L — ABNORMAL HIGH (ref 39–117)
BUN: <3 mg/dL — ABNORMAL LOW (ref 6–23)
Chloride: 103 mEq/L (ref 96–112)
GFR calc Af Amer: >90 mL/min (ref >90)
Glucose, Bld: 104 mg/dL — ABNORMAL HIGH (ref 70–99)
Potassium: 3.7 mEq/L (ref 3.5–5.1)
Sodium: 134 mEq/L — ABNORMAL LOW (ref 135–145)
Total Bilirubin: 0.4 mg/dL (ref 0.3–1.2)
Total Protein: 6.1 g/dL (ref 6.0–8.3)

## 2013-07-30 LAB — CBC
HCT: 30.9 % — ABNORMAL LOW (ref 36.0–46.0)
MCHC: 32 g/dL (ref 30.0–36.0)
MCV: 82.8 fL (ref 78.0–100.0)
Platelets: 224 10*3/uL (ref 150–400)
RDW: 14.7 % (ref 11.5–15.5)
WBC: 9 10*3/uL (ref 4.0–10.5)

## 2013-07-30 LAB — PROTEIN / CREATININE RATIO, URINE: Total Protein, Urine: 6 mg/dL

## 2013-07-30 MED ORDER — HYDROCODONE-ACETAMINOPHEN 5-325 MG PO TABS
1.0000 | ORAL_TABLET | Freq: Once | ORAL | Status: AC
  Administered 2013-07-30: 1 via ORAL
  Filled 2013-07-30: qty 1

## 2013-07-30 MED ORDER — CYCLOBENZAPRINE HCL 10 MG PO TABS: 5.0000 mg | ORAL_TABLET | Freq: Three times a day (TID) | ORAL | Status: DC | PRN

## 2013-07-30 NOTE — Progress Notes (Signed)
Pt tearful during visit.  Reports constant back pain and feels abdominal cramping when urinating. Urine sent for culture. Also reports increase in d/c, clear fluid sometimes.  Neg pooling, neg amniswab, neg ferning.  Wet prep done. Reports less fetal movement last few days, denies vaginal bleeding, regular contractions. Mild h/a without change for last week.  BP retake 131/87.  +1 edema BLE and BUE.  Sent to MAU for eval.

## 2013-07-30 NOTE — Progress Notes (Signed)
P - 94 - Pt states she may have UTI due to burning while urinating and LBP with 8/10 pain scale

## 2013-07-30 NOTE — Addendum Note (Signed)
Addended by: Sharen Counter A on: 07/30/2013 02:39 PM   Modules accepted: Orders

## 2013-07-30 NOTE — MAU Note (Signed)
Patient states she was seen for her regular office visit and had elevated blood pressure. Sent to MAU for evaluation. States increasing swelling and headaches. Reports good fetal movement, having some lower abdominal pressure, no bleeding or leaking.

## 2013-07-30 NOTE — MAU Note (Signed)
Patient is in the lab have blood drawn.

## 2013-07-30 NOTE — MAU Provider Note (Signed)
Chief Complaint:  Hypertension   Kylie Richards is a 22 y.o.  G1P0000 with IUP at [redacted]w[redacted]d presenting for Hypertension  Pt is here after being seen in clinic in [place]. States at that visit had a bp of 146/92 and then repeat was 131/82. States has had a persistent HA ever since being diagnosed with a sinus infection. Pain is frontal and throbbing pressure. NO  vision changes, no RUQ pain.  Is having some mild edema of feet and hands. No fevers, chills, dysuria.    Patient states she has been having  none contractions, no vaginal bleeding, intact membranes, with active fetal movement.    Menstrual History: OB History   Grav Para Term Preterm Abortions TAB SAB Ect Mult Living   1 0 0 0 0 0 0 0 0 0      Patient's last menstrual period was 11/12/2012.      Past Medical History  Diagnosis Date  . Recurrent UTI   . Anemia     Past Surgical History  Procedure Laterality Date  . Cholecystectomy  12/13/1996    Rupture    Family History  Problem Relation Age of Onset  . Diabetes Mother   . Obesity Mother   . Drug abuse Mother   . Depression Mother   . Anxiety disorder Mother   . Hypertension Mother   . Sleep apnea Father   . Hypertension Maternal Grandmother   . Diabetes Maternal Grandmother   . Thyroid cancer Maternal Grandmother   . Heart disease Maternal Grandmother   . Kidney disease Sister   . Urinary tract infection Sister     recurrent  . Cervical cancer Maternal Aunt   . Cervical cancer Paternal Aunt     History  Substance Use Topics  . Smoking status: Never Smoker   . Smokeless tobacco: Never Used  . Alcohol Use: No      Allergies  Allergen Reactions  . Sulfa Antibiotics Other (See Comments)    Unknown childhood rxn    Prescriptions prior to admission  Medication Sig Dispense Refill  . acetaminophen (TYLENOL) 325 MG tablet Take 650 mg by mouth every 6 (six) hours as needed (cold).      . cyclobenzaprine (FLEXERIL) 10 MG tablet Take 0.5-1 tablets  (5-10 mg total) by mouth 3 (three) times daily as needed for muscle spasms.  30 tablet  2  . ferrous gluconate (FERGON) 325 MG tablet Take 325 mg by mouth 2 (two) times daily.      . Prenatal Vit-Fe Fumarate-FA (PRENATAL MULTIVITAMIN) TABS Take 1 tablet by mouth every morning.      Marland Kitchen amoxicillin (AMOXIL) 500 MG capsule Take 500 mg by mouth 2 (two) times daily. X 7 days. Started on 07/22/2013        Review of Systems - Negative except for what is mentioned in HPI.  Physical Exam  Blood pressure 130/81, pulse 108, temperature 98.1 F (36.7 C), temperature source Oral, resp. rate 20, last menstrual period 11/12/2012, SpO2 100.00%. GENERAL: Well-developed, well-nourished female in no acute distress.  LUNGS: Clear to auscultation bilaterally.  HEART: Regular rate and rhythm. ABDOMEN: Soft, nontender, nondistended, gravid.  EXTREMITIES: Nontender, no edema, 2+ distal pulses. 2+ DTRs, no clonus FHT:  Baseline rate 140 bpm   Variability moderate  Accelerations present   Decelerations none Contractions: quiet.    Labs: Results for orders placed during the hospital encounter of 07/30/13 (from the past 24 hour(s))  COMPREHENSIVE METABOLIC PANEL   Collection Time  07/30/13  2:44 PM      Result Value Range   Sodium 134 (*) 135 - 145 mEq/L   Potassium 3.7  3.5 - 5.1 mEq/L   Chloride 103  96 - 112 mEq/L   CO2 21  19 - 32 mEq/L   Glucose, Bld 104 (*) 70 - 99 mg/dL   BUN <3 (*) 6 - 23 mg/dL   Creatinine, Ser 2.13  0.50 - 1.10 mg/dL   Calcium 9.4  8.4 - 08.6 mg/dL   Total Protein 6.1  6.0 - 8.3 g/dL   Albumin 2.5 (*) 3.5 - 5.2 g/dL   AST 15  0 - 37 U/L   ALT 9  0 - 35 U/L   Alkaline Phosphatase 199 (*) 39 - 117 U/L   Total Bilirubin 0.4  0.3 - 1.2 mg/dL   GFR calc non Af Amer >90  >90 mL/min   GFR calc Af Amer >90  >90 mL/min  CBC   Collection Time    07/30/13  2:44 PM      Result Value Range   WBC 9.0  4.0 - 10.5 K/uL   RBC 3.73 (*) 3.87 - 5.11 MIL/uL   Hemoglobin 9.9 (*) 12.0 - 15.0  g/dL   HCT 57.8 (*) 46.9 - 62.9 %   MCV 82.8  78.0 - 100.0 fL   MCH 26.5  26.0 - 34.0 pg   MCHC 32.0  30.0 - 36.0 g/dL   RDW 52.8  41.3 - 24.4 %   Platelets 224  150 - 400 K/uL  POCT URINALYSIS DIPSTICK   Collection Time    07/30/13 11:14 AM      Result Value Range   Color, UA Yellow     Clarity, UA Cloudy     Glucose, UA Neg     Bilirubin, UA Neg     Ketones, UA Neg     Spec Grav, UA 1.015     Blood, UA Neg     pH, UA 7.0     Protein, UA Trace     Urobilinogen, UA 0.2     Nitrite, UA Neg     Leukocytes, UA small (1+)      Imaging Studies:  No results found.  Assessment: Kylie Richards is  22 y.o. G1P0000 at [redacted]w[redacted]d presents with Hypertension .  Plan: [facility] labs sent given hx and negative.  - BP here 120-130/60-80.  No severe elevated pressures - likely HA more related to sinus pressure in quality and history. Discussed nasal rinses again. vicodin given with no improvement as a trial - will d/c to home with canister for 24 hour urine collection.  Pt to return it to [place].   Return precautions discussed.   Patrice Moates L 8/18/20143:51 PM

## 2013-07-31 NOTE — MAU Provider Note (Signed)
Attestation of Attending Supervision of Advanced Practitioner (CNM/NP): Evaluation and management procedures were performed by the Advanced Practitioner under my supervision and collaboration.  I have reviewed the Advanced Practitioner's note and chart, and I agree with the management and plan.  [name] 07/31/2013 6:51 AM

## 2013-07-31 NOTE — MAU Provider Note (Signed)
Attestation of Attending Supervision of Advanced Practitioner (CNM/NP)/Fellow: Evaluation and management procedures were performed by the Advanced Practitioner under my supervision and collaboration.  I have reviewed the Advanced Practitioner's note and chart, and I agree with the management and plan.  HARRAWAY-SMITH, Khadir Roam 11:41 AM

## 2013-08-01 ENCOUNTER — Inpatient Hospital Stay (HOSPITAL_COMMUNITY)
Admission: AD | Admit: 2013-08-01 | Discharge: 2013-08-01 | Disposition: A | Discharge status: Home / Self Care | Payer: BC Managed Care – PPO | Source: Ambulatory Visit | Attending: Obstetrics & Gynecology | Admitting: Obstetrics & Gynecology

## 2013-08-01 ENCOUNTER — Encounter (HOSPITAL_COMMUNITY): Payer: Self-pay | Admitting: *Deleted

## 2013-08-01 ENCOUNTER — Telehealth: Payer: Self-pay | Admitting: *Deleted

## 2013-08-01 DIAGNOSIS — O139 Gestational [pregnancy-induced] hypertension without significant proteinuria, unspecified trimester: Secondary | ICD-10-CM | POA: Insufficient documentation

## 2013-08-01 DIAGNOSIS — R51 Headache: Secondary | ICD-10-CM | POA: Insufficient documentation

## 2013-08-01 DIAGNOSIS — B9689 Other specified bacterial agents as the cause of diseases classified elsewhere: Secondary | ICD-10-CM

## 2013-08-01 HISTORY — DX: Calculus of kidney: N20.0

## 2013-08-01 HISTORY — DX: Gestational (pregnancy-induced) hypertension without significant proteinuria, unspecified trimester: O13.9

## 2013-08-01 HISTORY — DX: Anxiety disorder, unspecified: F41.9

## 2013-08-01 LAB — WET PREP, GENITAL

## 2013-08-01 LAB — PROTEIN, URINE, 24 HOUR
Collection Interval-UPROT: 24 hours
Protein, 24H Urine: 144 mg/d — ABNORMAL HIGH (ref 50–100)
Protein, Urine: 8 mg/dL

## 2013-08-01 MED ORDER — DIPHENHYDRAMINE HCL 50 MG/ML IJ SOLN
25.0000 mg | Freq: Once | INTRAMUSCULAR | Status: AC
  Administered 2013-08-01: 11:00:00 via INTRAVENOUS
  Filled 2013-08-01: qty 1

## 2013-08-01 MED ORDER — DEXAMETHASONE SODIUM PHOSPHATE 10 MG/ML IJ SOLN
10.0000 mg | Freq: Once | INTRAMUSCULAR | Status: AC
  Administered 2013-08-01: 10 mg via INTRAVENOUS
  Filled 2013-08-01: qty 1

## 2013-08-01 MED ORDER — SODIUM CHLORIDE 0.9 % IV SOLN
Freq: Once | INTRAVENOUS | Status: AC
  Administered 2013-08-01: 11:00:00 via INTRAVENOUS

## 2013-08-01 MED ORDER — SUMATRIPTAN SUCCINATE 25 MG PO TABS: 25.0000 mg | ORAL_TABLET | ORAL | Status: DC | PRN

## 2013-08-01 MED ORDER — PROCHLORPERAZINE EDISYLATE 5 MG/ML IJ SOLN
10.0000 mg | Freq: Once | INTRAMUSCULAR | Status: AC
  Administered 2013-08-01: 10 mg via INTRAVENOUS
  Filled 2013-08-01: qty 2

## 2013-08-01 MED ORDER — METRONIDAZOLE 500 MG PO TABS: 500.0000 mg | ORAL_TABLET | Freq: Two times a day (BID) | ORAL | Status: DC

## 2013-08-01 MED ORDER — SUMATRIPTAN SUCCINATE 25 MG PO TABS
25.0000 mg | ORAL_TABLET | ORAL | Status: DC | PRN
Start: 2013-08-01 — End: 2013-08-01
  Administered 2013-08-01: 25 mg via ORAL
  Filled 2013-08-01: qty 1

## 2013-08-01 NOTE — MAU Provider Note (Signed)
History     CSN: [id number]  Arrival date and time: 08/01/13 5284   First Provider Initiated Contact with Patient 08/01/13 1023      No chief complaint on file.  HPI Kylie Richards is a 22 y.o. G1P0000 at [redacted]w[redacted]d came by to drop off 24hr urine and complaining of severe headache with photo and phonophobia. Pt states it has been worsening over the last week. No improvement. Pt states that she sees some small spots with her headaches previously and present at this time. Pt is being evaluated for Preeclampsia and pending 24hr urine but otherwise labs were WNL on 8/18.  OB History   Grav Para Term Preterm Abortions TAB SAB Ect Mult Living   1 0 0 0 0 0 0 0 0 0       Past Medical History  Diagnosis Date  . Recurrent UTI   . Anemia   . Pregnancy induced hypertension   . Kidney stone   . Anxiety     Past Surgical History  Procedure Laterality Date  . Cholecystectomy  12/13/1996    Rupture    Family History  Problem Relation Age of Onset  . Diabetes Mother   . Obesity Mother   . Drug abuse Mother   . Depression Mother   . Anxiety disorder Mother   . Hypertension Mother   . Sleep apnea Father   . Hypertension Maternal Grandmother   . Diabetes Maternal Grandmother   . Thyroid cancer Maternal Grandmother   . Heart disease Maternal Grandmother   . Kidney disease Sister   . Urinary tract infection Sister     recurrent  . Cervical cancer Maternal Aunt   . Cervical cancer Paternal Aunt     History  Substance Use Topics  . Smoking status: Never Smoker   . Smokeless tobacco: Never Used  . Alcohol Use: Yes     Comment: not with preg    Allergies:  Allergies  Allergen Reactions  . Sulfa Antibiotics Other (See Comments)    Unknown childhood rxn    Prescriptions prior to admission  Medication Sig Dispense Refill  . acetaminophen (TYLENOL) 325 MG tablet Take 650 mg by mouth every 6 (six) hours as needed for pain.       . ferrous gluconate (FERGON) 325 MG tablet Take  325 mg by mouth 2 (two) times daily.      . Prenatal Vit-Fe Fumarate-FA (PRENATAL MULTIVITAMIN) TABS Take 1 tablet by mouth every morning.      . metroNIDAZOLE (FLAGYL) 500 MG tablet Take 1 tablet (500 mg total) by mouth 2 (two) times daily.  14 tablet  0    Review of Systems  Constitutional: Negative for fever and chills.  HENT: Negative for congestion and sore throat.        Phonophobia   Eyes: Positive for photophobia. Negative for pain and discharge.  Respiratory: Negative for cough, hemoptysis and stridor.   Gastrointestinal: Negative for nausea and vomiting.  Neurological: Positive for dizziness and headaches. Negative for tingling, tremors and sensory change.   Physical Exam   Blood pressure 119/76, pulse 106, temperature 98.3 F (36.8 C), temperature source Oral, resp. rate 18, height 5\' 9"  (1.753 m), weight 105.053 kg (231 lb 9.6 oz), last menstrual period 11/12/2012. Filed Vitals:   08/01/13 0939 08/01/13 0945 08/01/13 1001 08/01/13 1251  BP: 135/74 129/85 130/80 119/76  Pulse: 110 109 102 106  Temp:      TempSrc:      Resp:  18  Height:      Weight:       FHT: 150s mod variability, mult Accels >15x15 no decels  Physical Exam  Nursing note and vitals reviewed. Constitutional: She is oriented to person, place, and time. She appears well-developed and well-nourished. Distressed: uncomfortable 2/2 headache.  HENT:  Head: Normocephalic and atraumatic.  Eyes: EOM are normal. Pupils are equal, round, and reactive to light. Right eye exhibits no discharge. Left eye exhibits no discharge. No scleral icterus.  Neck: Neck supple.  Cardiovascular: Normal rate, regular rhythm, normal heart sounds and intact distal pulses.  Exam reveals no gallop and no friction rub.   No murmur heard. Respiratory: Effort normal and breath sounds normal. No respiratory distress. She has no wheezes. She has no rales. She exhibits no tenderness.  Neurological: She is alert and oriented to  person, place, and time. She has normal reflexes. No cranial nerve deficit. Coordination normal.   CBC    Component Value Date/Time   WBC 9.0 07/30/2013 1444   RBC 3.73* 07/30/2013 1444   HGB 9.9* 07/30/2013 1444   HGB 13.5 01/09/2013   HCT 30.9* 07/30/2013 1444   HCT 40 01/09/2013   PLT 224 07/30/2013 1444   PLT 220 01/09/2013   MCV 82.8 07/30/2013 1444   MCH 26.5 07/30/2013 1444   MCHC 32.0 07/30/2013 1444   RDW 14.7 07/30/2013 1444    CMP     Component Value Date/Time   NA 134* 07/30/2013 1444   K 3.7 07/30/2013 1444   CL 103 07/30/2013 1444   CO2 21 07/30/2013 1444   GLUCOSE 104* 07/30/2013 1444   BUN <3* 07/30/2013 1444   CREATININE 0.58 07/30/2013 1444   CREATININE 0.54 07/23/2013 1200   CALCIUM 9.4 07/30/2013 1444   PROT 6.1 07/30/2013 1444   ALBUMIN 2.5* 07/30/2013 1444   AST 15 07/30/2013 1444   ALT 9 07/30/2013 1444   ALKPHOS 199* 07/30/2013 1444   BILITOT 0.4 07/30/2013 1444   GFRNONAA >90 07/30/2013 1444   GFRAA >90 07/30/2013 1444   Urine Protein: 144  FHt: 150s mod var +accels, no decels,  Toco: no ctx  MAU Course  Procedures  MDM Will treat with benadryl, compazine and decadron as migraine. Hydrate and 24hr reassuring. Headache improved to 8/10 and BPs improved as well. Will discharge home with imitrex and close follow up.  Assessment and Plan  GHTN and severe headache. BP improved, headache improving as well. Will discharge home with imitrex and  close follow up in clinic. Plan to follow up early next week. Pt has no evidence on lab for preeclampsia and urine protein reassuring.   Jolyn Lent RYAN 08/01/2013, 1:29 PM

## 2013-08-01 NOTE — Telephone Encounter (Signed)
Pt positive for mod clue cells on wet prep.  RX for Flagyl sent to pharmacy.  Pt called this AM stating that she had such a bad headache a 9 on pain scale with nausea.  She is turning in her 24 hr urine this morning and have recommended that she go on over to MAU for evaluation.  She did she Lis Leftwich-Kirby on Monday and was told if the headaches were not ceasing to go to MAU for evaluation.

## 2013-08-01 NOTE — MAU Note (Signed)
Bad headache, worst ever,  Denies hx of migraines, never had a headache like this.  Reports increased swelling in hands, feet and face, just doesn't feel well.

## 2013-08-01 NOTE — Addendum Note (Signed)
Addended by: Granville Lewis on: 08/01/2013 08:08 AM   Modules accepted: Orders

## 2013-08-01 NOTE — MAU Note (Signed)
Called lab, urine did not get picked up until 1100.

## 2013-08-06 ENCOUNTER — Other Ambulatory Visit: Payer: Self-pay | Admitting: Obstetrics & Gynecology

## 2013-08-06 ENCOUNTER — Ambulatory Visit (INDEPENDENT_AMBULATORY_CARE_PROVIDER_SITE_OTHER): Payer: Self-pay | Admitting: Advanced Practice Midwife

## 2013-08-06 ENCOUNTER — Ambulatory Visit (HOSPITAL_COMMUNITY)
Admission: RE | Admit: 2013-08-06 | Discharge: 2013-08-06 | Disposition: A | Discharge status: Home / Self Care | Payer: BC Managed Care – PPO | Source: Ambulatory Visit | Attending: Advanced Practice Midwife | Admitting: Advanced Practice Midwife

## 2013-08-06 VITALS — BP 143/87 | Wt 233.0 lb

## 2013-08-06 DIAGNOSIS — O139 Gestational [pregnancy-induced] hypertension without significant proteinuria, unspecified trimester: Secondary | ICD-10-CM

## 2013-08-06 DIAGNOSIS — O133 Gestational [pregnancy-induced] hypertension without significant proteinuria, third trimester: Secondary | ICD-10-CM

## 2013-08-06 DIAGNOSIS — Z3689 Encounter for other specified antenatal screening: Secondary | ICD-10-CM | POA: Insufficient documentation

## 2013-08-06 DIAGNOSIS — Z8759 Personal history of other complications of pregnancy, childbirth and the puerperium: Secondary | ICD-10-CM | POA: Insufficient documentation

## 2013-08-06 DIAGNOSIS — Z3480 Encounter for supervision of other normal pregnancy, unspecified trimester: Secondary | ICD-10-CM

## 2013-08-06 NOTE — Progress Notes (Signed)
P - 96 - Pt states she thinks she has lost mucous plug yesterday

## 2013-08-06 NOTE — Progress Notes (Signed)
Good fetal movement, denies vaginal bleeding, LOF, regular contractions. Denies h/a, epigastric pain, visual disturbances, n/v.  Reports dizziness x2 days.  BP elevated again today. Consult with Harraway-Smith.  BPP with AFI today.  IOL at 39 weeks scheduled for Sunday. Membranes swept today.

## 2013-08-07 ENCOUNTER — Encounter (HOSPITAL_COMMUNITY): Payer: Self-pay | Admitting: *Deleted

## 2013-08-07 ENCOUNTER — Telehealth (HOSPITAL_COMMUNITY): Payer: Self-pay | Admitting: *Deleted

## 2013-08-07 ENCOUNTER — Other Ambulatory Visit (HOSPITAL_COMMUNITY): Payer: BC Managed Care – PPO

## 2013-08-07 NOTE — Telephone Encounter (Signed)
Preadmission screen  

## 2013-08-08 ENCOUNTER — Ambulatory Visit (INDEPENDENT_AMBULATORY_CARE_PROVIDER_SITE_OTHER): Payer: BC Managed Care – PPO | Admitting: *Deleted

## 2013-08-08 VITALS — BP 141/86 | Wt 234.0 lb

## 2013-08-08 DIAGNOSIS — O133 Gestational [pregnancy-induced] hypertension without significant proteinuria, third trimester: Secondary | ICD-10-CM

## 2013-08-08 DIAGNOSIS — M545 Low back pain: Secondary | ICD-10-CM

## 2013-08-08 DIAGNOSIS — O479 False labor, unspecified: Secondary | ICD-10-CM

## 2013-08-08 NOTE — Progress Notes (Signed)
p-112  Pt having lower back pain but no significant contractions.  Per her NST which is reactive no contractions noted.  Urine dip is neg.

## 2013-08-09 ENCOUNTER — Encounter (HOSPITAL_COMMUNITY): Payer: Self-pay | Admitting: *Deleted

## 2013-08-09 ENCOUNTER — Inpatient Hospital Stay (HOSPITAL_COMMUNITY)
Admission: AD | Admit: 2013-08-09 | Discharge: 2013-08-12 | DRG: 372 | Disposition: A | Discharge status: Home / Self Care | Payer: BC Managed Care – PPO | Source: Ambulatory Visit | Attending: Obstetrics & Gynecology | Admitting: Obstetrics & Gynecology

## 2013-08-09 DIAGNOSIS — O139 Gestational [pregnancy-induced] hypertension without significant proteinuria, unspecified trimester: Secondary | ICD-10-CM | POA: Diagnosis present

## 2013-08-09 DIAGNOSIS — M62830 Muscle spasm of back: Secondary | ICD-10-CM

## 2013-08-09 DIAGNOSIS — O133 Gestational [pregnancy-induced] hypertension without significant proteinuria, third trimester: Secondary | ICD-10-CM

## 2013-08-09 NOTE — MAU Note (Signed)
PT SAYS   SHE IS [facility] FOR INDUCTION ON Sunday.  SAYS HURT BAD AT 830PM.    VE ON YESTERDAY-   2 CM.    DENIES HSV AND MRSA.

## 2013-08-10 ENCOUNTER — Encounter (HOSPITAL_COMMUNITY): Payer: Self-pay | Admitting: *Deleted

## 2013-08-10 ENCOUNTER — Inpatient Hospital Stay (HOSPITAL_COMMUNITY): Payer: Self-pay | Admitting: Anesthesiology

## 2013-08-10 ENCOUNTER — Inpatient Hospital Stay (HOSPITAL_COMMUNITY): Payer: BC Managed Care – PPO | Admitting: Anesthesiology

## 2013-08-10 LAB — CBC
HCT: 31.9 % — ABNORMAL LOW (ref 36.0–46.0)
MCH: 26.3 pg (ref 26.0–34.0)
MCHC: 32.3 g/dL (ref 30.0–36.0)
MCV: 81.4 fL (ref 78.0–100.0)
Platelets: 238 10*3/uL (ref 150–400)
Platelets: 217 10*3/uL (ref 150–400)
RBC: 3.43 MIL/uL — ABNORMAL LOW (ref 3.87–5.11)
RDW: 15.5 % (ref 11.5–15.5)
WBC: 17.3 10*3/uL — ABNORMAL HIGH (ref 4.0–10.5)
WBC: 22.1 10*3/uL — ABNORMAL HIGH (ref 4.0–10.5)

## 2013-08-10 MED ORDER — ONDANSETRON HCL 4 MG/2ML IJ SOLN: 4.0000 mg | Freq: Four times a day (QID) | INTRAMUSCULAR | Status: DC | PRN

## 2013-08-10 MED ORDER — LACTATED RINGERS IV SOLN
500.0000 mL | INTRAVENOUS | Status: DC | PRN
Start: 2013-08-10 — End: 2013-08-10
  Administered 2013-08-10: 1000 mL via INTRAVENOUS

## 2013-08-10 MED ORDER — BENZOCAINE-MENTHOL 20-0.5 % EX AERO
1.0000 "application " | INHALATION_SPRAY | CUTANEOUS | Status: DC | PRN
  Administered 2013-08-10: 1 via TOPICAL
  Filled 2013-08-10: qty 56

## 2013-08-10 MED ORDER — LACTATED RINGERS IV SOLN
500.0000 mL | Freq: Once | INTRAVENOUS | Status: AC
  Administered 2013-08-10: 500 mL via INTRAVENOUS

## 2013-08-10 MED ORDER — LIDOCAINE HCL (PF) 1 % IJ SOLN
30.0000 mL | INTRAMUSCULAR | Status: DC | PRN
  Administered 2013-08-10: 30 mL via SUBCUTANEOUS
  Filled 2013-08-10 (×2): qty 30

## 2013-08-10 MED ORDER — PRENATAL MULTIVITAMIN CH
1.0000 | ORAL_TABLET | Freq: Every day | ORAL | Status: DC
  Administered 2013-08-11: 1 via ORAL
  Filled 2013-08-10: qty 1

## 2013-08-10 MED ORDER — LACTATED RINGERS IV SOLN
INTRAVENOUS | Status: DC
  Administered 2013-08-10: 04:00:00 via INTRAVENOUS

## 2013-08-10 MED ORDER — OXYCODONE-ACETAMINOPHEN 5-325 MG PO TABS: 1.0000 | ORAL_TABLET | ORAL | Status: DC | PRN

## 2013-08-10 MED ORDER — TERBUTALINE SULFATE 1 MG/ML IJ SOLN: 0.2500 mg | Freq: Once | INTRAMUSCULAR | Status: DC | PRN

## 2013-08-10 MED ORDER — OXYTOCIN 40 UNITS IN LACTATED RINGERS INFUSION - SIMPLE MED
62.5000 mL/h | INTRAVENOUS | Status: DC
  Filled 2013-08-10: qty 1000

## 2013-08-10 MED ORDER — ONDANSETRON HCL 4 MG/2ML IJ SOLN: 4.0000 mg | INTRAMUSCULAR | Status: DC | PRN

## 2013-08-10 MED ORDER — PHENYLEPHRINE 40 MCG/ML (10ML) SYRINGE FOR IV PUSH (FOR BLOOD PRESSURE SUPPORT)
80.0000 ug | PREFILLED_SYRINGE | INTRAVENOUS | Status: DC | PRN
  Filled 2013-08-10: qty 2

## 2013-08-10 MED ORDER — TETANUS-DIPHTH-ACELL PERTUSSIS 5-2.5-18.5 LF-MCG/0.5 IM SUSP: 0.5000 mL | Freq: Once | INTRAMUSCULAR | Status: DC

## 2013-08-10 MED ORDER — LACTATED RINGERS IV SOLN: INTRAVENOUS | Status: DC

## 2013-08-10 MED ORDER — FENTANYL 2.5 MCG/ML BUPIVACAINE 1/10 % EPIDURAL INFUSION (WH - ANES)
14.0000 mL/h | INTRAMUSCULAR | Status: DC | PRN
  Administered 2013-08-10 (×3): 14 mL/h via EPIDURAL
  Filled 2013-08-10 (×3): qty 125

## 2013-08-10 MED ORDER — ONDANSETRON HCL 4 MG PO TABS: 4.0000 mg | ORAL_TABLET | ORAL | Status: DC | PRN

## 2013-08-10 MED ORDER — PHENYLEPHRINE 40 MCG/ML (10ML) SYRINGE FOR IV PUSH (FOR BLOOD PRESSURE SUPPORT)
80.0000 ug | PREFILLED_SYRINGE | INTRAVENOUS | Status: DC | PRN
  Filled 2013-08-10: qty 2
  Filled 2013-08-10: qty 5

## 2013-08-10 MED ORDER — OXYTOCIN 40 UNITS IN LACTATED RINGERS INFUSION - SIMPLE MED
1.0000 m[IU]/min | INTRAVENOUS | Status: DC
  Administered 2013-08-10: 2 m[IU]/min via INTRAVENOUS
  Administered 2013-08-10: 666 m[IU]/min via INTRAVENOUS

## 2013-08-10 MED ORDER — EPHEDRINE 5 MG/ML INJ
10.0000 mg | INTRAVENOUS | Status: DC | PRN
  Filled 2013-08-10: qty 4
  Filled 2013-08-10: qty 2

## 2013-08-10 MED ORDER — FERROUS GLUCONATE 324 (38 FE) MG PO TABS
324.0000 mg | ORAL_TABLET | Freq: Two times a day (BID) | ORAL | Status: DC
  Administered 2013-08-11 – 2013-08-12 (×3): 324 mg via ORAL
  Filled 2013-08-10 (×3): qty 1

## 2013-08-10 MED ORDER — CITRIC ACID-SODIUM CITRATE 334-500 MG/5ML PO SOLN: 30.0000 mL | ORAL | Status: DC | PRN

## 2013-08-10 MED ORDER — EPHEDRINE 5 MG/ML INJ
10.0000 mg | INTRAVENOUS | Status: DC | PRN
  Filled 2013-08-10: qty 2

## 2013-08-10 MED ORDER — IBUPROFEN 600 MG PO TABS: 600.0000 mg | ORAL_TABLET | Freq: Four times a day (QID) | ORAL | Status: DC | PRN

## 2013-08-10 MED ORDER — LACTATED RINGERS IV SOLN: 500.0000 mL | INTRAVENOUS | Status: DC | PRN

## 2013-08-10 MED ORDER — OXYCODONE-ACETAMINOPHEN 5-325 MG PO TABS
1.0000 | ORAL_TABLET | Freq: Once | ORAL | Status: AC
  Administered 2013-08-10: 1 via ORAL
  Filled 2013-08-10: qty 1

## 2013-08-10 MED ORDER — ACETAMINOPHEN 325 MG PO TABS: 650.0000 mg | ORAL_TABLET | ORAL | Status: DC | PRN

## 2013-08-10 MED ORDER — OXYTOCIN 40 UNITS IN LACTATED RINGERS INFUSION - SIMPLE MED: 62.5000 mL/h | INTRAVENOUS | Status: DC

## 2013-08-10 MED ORDER — DIPHENHYDRAMINE HCL 50 MG/ML IJ SOLN: 12.5000 mg | INTRAMUSCULAR | Status: DC | PRN

## 2013-08-10 MED ORDER — OXYTOCIN BOLUS FROM INFUSION: 500.0000 mL | INTRAVENOUS | Status: DC

## 2013-08-10 MED ORDER — SIMETHICONE 80 MG PO CHEW: 80.0000 mg | CHEWABLE_TABLET | ORAL | Status: DC | PRN

## 2013-08-10 MED ORDER — DIPHENHYDRAMINE HCL 25 MG PO CAPS: 25.0000 mg | ORAL_CAPSULE | Freq: Four times a day (QID) | ORAL | Status: DC | PRN

## 2013-08-10 MED ORDER — DIBUCAINE 1 % RE OINT: 1.0000 "application " | TOPICAL_OINTMENT | RECTAL | Status: DC | PRN

## 2013-08-10 MED ORDER — MEASLES, MUMPS & RUBELLA VAC ~~LOC~~ INJ
0.5000 mL | INJECTION | Freq: Once | SUBCUTANEOUS | Status: DC
  Filled 2013-08-10: qty 0.5

## 2013-08-10 MED ORDER — FLEET ENEMA 7-19 GM/118ML RE ENEM: 1.0000 | ENEMA | RECTAL | Status: DC | PRN

## 2013-08-10 MED ORDER — IBUPROFEN 600 MG PO TABS
600.0000 mg | ORAL_TABLET | Freq: Four times a day (QID) | ORAL | Status: DC
  Administered 2013-08-10 – 2013-08-11 (×5): 600 mg via ORAL
  Filled 2013-08-10 (×6): qty 1

## 2013-08-10 MED ORDER — LIDOCAINE HCL (PF) 1 % IJ SOLN
INTRAMUSCULAR | Status: DC | PRN
  Administered 2013-08-10 (×4): 4 mL

## 2013-08-10 MED ORDER — ZOLPIDEM TARTRATE 5 MG PO TABS: 5.0000 mg | ORAL_TABLET | Freq: Every evening | ORAL | Status: DC | PRN

## 2013-08-10 MED ORDER — SENNOSIDES-DOCUSATE SODIUM 8.6-50 MG PO TABS
2.0000 | ORAL_TABLET | Freq: Every day | ORAL | Status: DC
  Administered 2013-08-10 – 2013-08-11 (×2): 2 via ORAL

## 2013-08-10 MED ORDER — LANOLIN HYDROUS EX OINT: TOPICAL_OINTMENT | CUTANEOUS | Status: DC | PRN

## 2013-08-10 MED ORDER — LIDOCAINE HCL (PF) 1 % IJ SOLN: 30.0000 mL | INTRAMUSCULAR | Status: DC | PRN

## 2013-08-10 MED ORDER — FENTANYL CITRATE 0.05 MG/ML IJ SOLN
50.0000 ug | Freq: Once | INTRAMUSCULAR | Status: AC
  Administered 2013-08-10: 50 ug via INTRAVENOUS
  Filled 2013-08-10 (×2): qty 2

## 2013-08-10 MED ORDER — FERROUS GLUCONATE 324 (38 FE) MG PO TABS: 325.0000 mg | ORAL_TABLET | Freq: Two times a day (BID) | ORAL | Status: DC

## 2013-08-10 MED ORDER — WITCH HAZEL-GLYCERIN EX PADS: 1.0000 "application " | MEDICATED_PAD | CUTANEOUS | Status: DC | PRN

## 2013-08-10 NOTE — Progress Notes (Signed)
Kylie Richards is a 22 y.o. G1P0000 at [redacted]w[redacted]d admitted for active labor  Subjective: comfortable, no complaints   Objective: BP 122/62  Pulse 92  Temp(Src) 98.4 F (36.9 C) (Oral)  Resp 18  Ht 5\' 9"  (1.753 m)  Wt 106.369 kg (234 lb 8 oz)  BMI 34.61 kg/m2  SpO2 98%  LMP 11/12/2012      FHT:  FHR: 145 bpm, variability: moderate,  accelerations:  Present,  decelerations:  Absent UC:   irregular, every 10 minutes SVE:   Dilation: 7 Effacement (%): 100 Station: -1 Exam by:: Dr Reola Calkins  Labs: Lab Results  Component Value Date   WBC 17.3* 08/10/2013   HGB 10.3* 08/10/2013   HCT 31.9* 08/10/2013   MCV 81.4 08/10/2013   PLT 238 08/10/2013    Assessment / Plan: Spontaneous labor, progressing normally  Labor: Progressing normally, AROM with medium meconium Preeclampsia:  n/a Fetal Wellbeing:  Category I Pain Control:  Epidural I/D:  n/a Anticipated MOD:  NSVD  Jacquelin Hawking, MD 08/10/2013, 9:50 AM

## 2013-08-10 NOTE — H&P (Addendum)
I have seen and examined this patient and agree the above assessment. CRESENZO-DISHMAN,Kylie Richards 08/10/2013 3:46 AM

## 2013-08-10 NOTE — H&P (Signed)
Kylie Richards is a 22 y.o. G1P0000 at [redacted]w[redacted]d presenting for active labor. Began having contractions this evening. Hasn't felt baby move as well as normal. Denies leaking of fluid or vaginal bleeding. Has had GHTN during this pregnancy but has not required any medications. Also reports low lying placenta which resolved.  History OB History   Grav Para Term Preterm Abortions TAB SAB Ect Mult Living   1 0 0 0 0 0 0 0 0 0      Past Medical History  Diagnosis Date  . Recurrent UTI   . Anemia   . Pregnancy induced hypertension   . Kidney stone   . Anxiety    Past Surgical History  Procedure Laterality Date  . Cholecystectomy  12/13/1996    Rupture   Family History: family history includes Anxiety disorder in her mother; Cervical cancer in her maternal aunt and paternal aunt; Depression in her mother; Diabetes in her maternal grandmother and mother; Drug abuse in her mother; Heart disease in her maternal grandmother; Hypertension in her maternal grandmother and mother; Kidney disease in her sister; Obesity in her mother; Sleep apnea in her father; Thyroid cancer in her maternal grandmother; Urinary tract infection in her sister. Social History:  reports that she has never smoked. She has never used smokeless tobacco. She reports that  drinks alcohol. She reports that she does not use illicit drugs.   Prenatal Transfer Tool  Maternal Diabetes: No Genetic Screening: Declined Maternal Ultrasounds/Referrals: Normal Fetal Ultrasounds or other Referrals:  None Maternal Substance Abuse:  No Significant Maternal Medications:  Meds include: Other:  Significant Maternal Lab Results:  Lab values include: Group B Strep negative, Rh negative Other Comments:  takes flexeril  ROS denies HA, vision changes, RUQ pain, LOF, VB. + ctx. ?decreased FM.  Blood pressure 138/85, pulse 109, temperature 98.6 F (37 C), temperature source Oral, resp. rate 20, height 5\' 9"  (1.753 m), weight 234 lb 8 oz (106.369  kg), last menstrual period 11/12/2012. Exam Physical Exam  Gen: NAD Heart: RRR Lungs: CTAB, NWOB Abd: gravid but otherwise soft, nontender to palpation Ext: no appreciable lower extremity edema bilaterally Neuro: grossly nonfocal, speech intact GU: Cervix: 3-4cm per RN  Prenatal labs: ABO, Rh: A/Negative/-- (01/28 0000) Antibody: NEG (06/23 1027) Rubella: Immune (01/28 0000) RPR: NON REAC (06/23 1027)  HBsAg: Negative (01/28 0000)  HIV: NON REACTIVE (06/23 1027)  GBS: Negative (08/14 0000)   FHR: baseline 120s, moderate variability, + accels, no decels Toco: regular ctx q 2-3 minutes   Assessment/Plan: Kylie Richards is a 22 y.o. G1P0000 at [redacted]w[redacted]d who presents in active labor at term (made cervical change while in MAU).   -Admit to L&D -Has GHTN not requiring medications - continue to monitor closely. No current symtpoms for pre-eclampsia -GBS negative -anticipate NSVD.  -plans for epidural, may have fentanyl for now until able to get epidural  Levert Feinstein 08/10/2013, 2:57 AM

## 2013-08-10 NOTE — Anesthesia Preprocedure Evaluation (Addendum)
Anesthesia Evaluation  Patient identified by MRN, date of birth, ID band Patient awake    Reviewed: Allergy & Precautions, H&P , NPO status , Patient's Chart, lab work & pertinent test results, reviewed documented beta blocker date and time   History of Anesthesia Complications Negative for: history of anesthetic complications  Airway Mallampati: II TM Distance: >3 FB Neck ROM: full    Dental  (+) Teeth Intact   Pulmonary neg pulmonary ROS,  breath sounds clear to auscultation        Cardiovascular hypertension (PIH), Rhythm:regular Rate:Normal     Neuro/Psych PSYCHIATRIC DISORDERS (anxiety) negative neurological ROS     GI/Hepatic negative GI ROS, Neg liver ROS,   Endo/Other  Morbid obesity (BMI 35)  Renal/GU Renal disease (h/o kidney stones)     Musculoskeletal   Abdominal   Peds  Hematology  (+) anemia ,   Anesthesia Other Findings   Reproductive/Obstetrics (+) Pregnancy                           Anesthesia Physical Anesthesia Plan  ASA: III  Anesthesia Plan: Epidural   Post-op Pain Management:    Induction:   Airway Management Planned:   Additional Equipment:   Intra-op Plan:   Post-operative Plan:   Informed Consent: I have reviewed the patients History and Physical, chart, labs and discussed the procedure including the risks, benefits and alternatives for the proposed anesthesia with the patient or authorized representative who has indicated his/her understanding and acceptance.     Plan Discussed with:   Anesthesia Plan Comments:        Anesthesia Quick Evaluation

## 2013-08-10 NOTE — Progress Notes (Signed)
Bladder scanned at 2355 with result of 999 ml. Patient states the urge to void but after trying could not x3. Minimum lochia with fundus slightly to L. I/0 Cath 925 ml. Will continue to monitor

## 2013-08-10 NOTE — Anesthesia Procedure Notes (Signed)
Epidural Patient location during procedure: OB Start time: 08/10/2013 4:03 AM  Staffing Performed by: anesthesiologist   Preanesthetic Checklist Completed: patient identified, site marked, surgical consent, pre-op evaluation, timeout performed, IV checked, risks and benefits discussed and monitors and equipment checked  Epidural Patient position: sitting Prep: site prepped and draped and DuraPrep Patient monitoring: continuous pulse ox and blood pressure Approach: midline Injection technique: LOR air  Needle:  Needle type: Tuohy  Needle gauge: 17 G Needle length: 9 cm and 9 Needle insertion depth: 6 cm Catheter type: closed end flexible Catheter size: 19 Gauge Catheter at skin depth: 11 cm Test dose: negative  Assessment Events: blood not aspirated, injection not painful, no injection resistance, negative IV test and no paresthesia  Additional Notes Discussed risk of headache, infection, bleeding, nerve injury and failed or incomplete block.  Patient voices understanding and wishes to proceed.  Epidural placed easily on first attempt.  No paresthesia.  Patient tolerated procedure well with no apparent complications.  Jasmine December, MD Reason for block:procedure for pain

## 2013-08-10 NOTE — Progress Notes (Signed)
Kylie Richards is a 22 y.o. G1P0000 at [redacted]w[redacted]d admitted for onset of labor  Subjective:  Pt comfortable but feeling some vaginal pressure.  +FM  Objective: BP 120/71  Pulse 118  Temp(Src) 99 F (37.2 C) (Oral)  Resp 18  Ht 5\' 9"  (1.753 m)  Wt 106.369 kg (234 lb 8 oz)  BMI 34.61 kg/m2  SpO2 98%  LMP 11/12/2012   Total I/O In: -  Out: 1600 [Urine:1600]  FHT:  FHR: 145 bpm, variability: moderate,  accelerations:  Present,  decelerations:  Absent UC:   Difficult to trace but making change  SVE:   Dilation: 10 Effacement (%): 90 Station: +1 Exam by:: Dr Kylie Richards  Labs: Lab Results  Component Value Date   WBC 17.3* 08/10/2013   HGB 10.3* 08/10/2013   HCT 31.9* 08/10/2013   MCV 81.4 08/10/2013   PLT 238 08/10/2013    Assessment / Plan: Spontaneous labor, progressing normally  Labor: progressing. on 12mu of pit GHTN: bp stable Fetal Wellbeing:  Category I Pain Control:  Epidural I/D:  n/a Anticipated MOD:  NSVD  Kylie Richards L 08/10/2013, 2:52 PM

## 2013-08-11 NOTE — Progress Notes (Signed)
Post Partum Day 1 Subjective: up ad lib, tolerating PO and some difficulty with voiding due to marked vulvar swelling. Has required in and out catheterizations. Also passed a large blood clot in the shower. Denies feeling lightheaded with standing. Eating and drinking well.   Objective: Blood pressure 118/69, pulse 105, temperature 98.3 F (36.8 C), temperature source Oral, resp. rate 18, height 5\' 9"  (1.753 m), weight 106.369 kg (234 lb 8 oz), last menstrual period 11/12/2012, SpO2 98.00%, unknown if currently breastfeeding.  Physical Exam:  General: alert, cooperative and no distress Lochia: appropriate Uterine Fundus: firm Incision: n/a DVT Evaluation: No evidence of DVT seen on physical exam. Negative Homan's sign. No cords or calf tenderness.   Recent Labs  08/10/13 0230 08/10/13 2025  HGB 10.3* 9.0*  HCT 31.9* 27.7*    Assessment/Plan: Plan for discharge tomorrow and Contraception undecided Breastfeeding  Continue to monitor bleeding closely. In and out catheterizations if retaining urine.   LOS: 2 days   Kylie Richards 08/11/2013, 9:05 AM

## 2013-08-11 NOTE — Anesthesia Postprocedure Evaluation (Signed)
  Anesthesia Post-op Note  Patient: Kylie Richards  Procedure(s) Performed: * No procedures listed *  Patient Location: Mother/Baby  Anesthesia Type:Epidural  Level of Consciousness: awake  Airway and Oxygen Therapy: Patient Spontanous Breathing  Post-op Pain: mild  Post-op Assessment: Patient's Cardiovascular Status Stable and Respiratory Function Stable  Post-op Vital Signs: stable  Complications: No apparent anesthesia complications

## 2013-08-11 NOTE — Progress Notes (Signed)
Clinical Social Work Department PSYCHOSOCIAL ASSESSMENT - MATERNAL/CHILD 08/11/2013  Patient:  Kylie Richards,Kylie Richards  Account Number:  [id number]  Admit Date:  08/09/2013  Childs Name:   Carter Gardner    Clinical Social Worker:  Bassheva Flury, LCSW   Date/Time:  08/11/2013 01:00 PM  Date Referred:  08/10/2013   Referral source  Central Nursery     Referred reason  Depression/Anxiety   Other referral source:    I:  FAMILY / HOME ENVIRONMENT Child's legal guardian:    Guardian - Name Guardian - Age Guardian - Address  Ec[place], Kaelene [address]  [place], [place] [address]  Gardner, Justin  same as above   Other household support members/support persons Other support:   P    II  PSYCHOSOCIAL DATA Information Source:    Financial and Community Resources Employment:   Grand parents, friends and other relatives   Financial resources:  Private Insurance If Medicaid - County:    School / Grade:   Maternity Care Coordinator / Child Services Coordination / Early Interventions:  Cultural issues impacting care:   None reported or noted at this time    III  STRENGTHS Strengths  Supportive family/friends  Adequate Resources  Home prepared for Child (including basic supplies)  Compliance with medical plan   Strength comment:    IV  RISK FACTORS AND CURRENT PROBLEMS Current Problem:  None   Risk Factor & Current Problem Patient Issue Family Issue Risk Factor / Current Problem Comment   N N     V  SOCIAL WORK ASSESSMENT  Met with mother who was pleasant and receptive to social work intervention.  She and newborn's father cohabitate.  She have no other dependents.  Both parents are employed.   Mother states that she was treated for anxiety during her freshman year in college about 3 years ago.  She reportedly took medication for a short period of time.  She denies hx of psychiatric hospitalization and states that she has not taken medication in about 3 years.  She denies  SI and reports no depressive symptoms or anxiety.  She also denies any hx of substance abuse.  Mother reports extensive family support.  Father plans to take time off from work and several family members have offered their support.  No acute social concerns noted or reported at this time.  She plans to use [facility] in [place] for well-baby care.  CSW will follow PRN.    VI SOCIAL WORK PLAN Social Work Plan  No Further Intervention Required / No Barriers to Discharge   Type of pt/family education:   Information/referral to community resources comment:   Provide information and literature on post partum depression   Taysom Glymph J, LCSW  

## 2013-08-11 NOTE — Progress Notes (Signed)
I have seen this patient and agree with the above resident's note.  LEFTWICH-KIRBY, Ellayna Hilligoss Certified Nurse-Midwife 

## 2013-08-12 ENCOUNTER — Inpatient Hospital Stay (HOSPITAL_COMMUNITY): Admission: RE | Admit: 2013-08-12 | Payer: BC Managed Care – PPO | Source: Ambulatory Visit

## 2013-08-12 NOTE — Discharge Instructions (Signed)

## 2013-08-12 NOTE — Discharge Summary (Signed)
Obstetric Discharge Summary Reason for Admission: onset of labor Prenatal Procedures: NST Intrapartum Procedures: spontaneous vaginal delivery and shoulder dystocia <30s 2nd deg rear Postpartum Procedures: none Complications-Operative and Postpartum: 2nd degree perineal laceration Hemoglobin  Date Value Range Status  08/10/2013 9.0* 12.0 - 15.0 g/dL Final  0/98/1191 47.8   Final     HCT  Date Value Range Status  08/10/2013 27.7* 36.0 - 46.0 % Final  01/09/2013 40   Final    Physical Exam:  General: alert, cooperative, appears stated age and no distress Lochia: appropriate Uterine Fundus: firm CTAB no w/rc/ RRR no m/g/t DVT Evaluation: No evidence of DVT seen on physical exam. Negative Homan's sign. No cords or calf tenderness. Calf/Ankle edema is present.  Discharge Diagnoses: Term Pregnancy-delivered  Discharge Information: Date: 08/12/2013 Activity: pelvic rest Diet: routine Medications: PNV and Ibuprofen Condition: stable Instructions: refer to practice specific booklet Discharge to: home Follow-up Information   [facility] up with [facility] [place] In 4 weeks. (make appt for mirena)    Specialty:  Obstetrics and Gynecology   Contact information:   [address] [place] [address] [phone]    MOF: BReast MOC: Mirena HTN: BPs wnl PP, continue to f/u PP  Newborn Data: Live born female  Birth Weight: 9 lb 6.4 oz (4265 g) APGAR: 6, 9  [name] 08/12/2013, 9:48 AM

## 2013-08-14 NOTE — Discharge Summary (Signed)
Attestation of Attending Supervision of Obstetric Fellow: Evaluation and management procedures were performed by the Obstetric Fellow under my supervision and collaboration.  I have reviewed the Obstetric Fellow's note and chart, and I agree with the management and plan.  Khamya Topp, MD, FACOG Attending Obstetrician & Gynecologist Faculty Practice, [facility]   

## 2013-08-15 NOTE — H&P (Signed)
Attestation of Attending Supervision of Advanced Practitioner (PA/CNM/NP): Evaluation and management procedures were performed by the Advanced Practitioner under my supervision and collaboration.  I have reviewed the Advanced Practitioner's note and chart, and I agree with the management and plan.  Clifford Benninger, MD, FACOG Attending Obstetrician & Gynecologist Faculty Practice, [facility]  

## 2013-09-21 ENCOUNTER — Ambulatory Visit (INDEPENDENT_AMBULATORY_CARE_PROVIDER_SITE_OTHER): Payer: BC Managed Care – PPO | Admitting: Family

## 2013-09-21 ENCOUNTER — Encounter: Payer: Self-pay | Admitting: Family

## 2013-09-21 NOTE — Progress Notes (Signed)
  Subjective:     Kylie Richards is a 22 y.o. female who presents for a postpartum visit. She is 6 weeks postpartum following a spontaneous vaginal delivery. I have fully reviewed the prenatal and intrapartum course. The delivery was at 38 gestational weeks. Outcome: spontaneous vaginal delivery. Anesthesia: epidural. Postpartum course has been unremarkable. Baby's course has been unremarkable except for diagnosis of umbilical hernia with expectant management until starts walking. Baby is feeding by both breast and bottle - Similac for Supplementation. Bleeding LMP 09/20/13. Bowel function is normal. Bladder function is normal. Patient is not sexually active. Contraception method is none. Postpartum depression screening: negative.  The following portions of the patient's history were reviewed and updated as appropriate: allergies, current medications, past family history, past medical history, past social history, past surgical history and problem list.  Review of Systems Pertinent items are noted in HPI.   Objective:    BP 113/74  Pulse 83  Resp 16  Ht 5\' 10"  (1.778 m)  Wt 197 lb (89.359 kg)  BMI 28.27 kg/m2  LMP 09/20/2013  Breastfeeding? Yes        General:  alert, cooperative and appears stated age   Breasts:  inspection negative, no nipple discharge or bleeding, no masses or nodularity palpable  Lungs: clear to auscultation bilaterally  Heart:  regular rate and rhythm, S1, S2 normal, no murmur, click, rub or gallop  Abdomen: soft, non-tender; bowel sounds normal; no masses,  no organomegaly   Vulva:  normal  Vagina: normal vagina, no discharge, exudate, lesion, or erythema; healed well; scant vaginal bleeding.  Cervix:  no cervical motion tenderness  Corpus: normal size, contour, position, consistency, mobility, non-tender  Adnexa:  normal adnexa  Rectal Exam: Not performed.   Assessment:     Normal postpartum exam. Pap smear not done at today's visit.   Plan:    1.  Contraception: IUD; will schedule in two weeks for Mirena.   [facility]

## 2013-10-05 ENCOUNTER — Ambulatory Visit: Payer: BC Managed Care – PPO | Admitting: Family

## 2013-10-05 DIAGNOSIS — Z30430 Encounter for insertion of intrauterine contraceptive device: Secondary | ICD-10-CM

## 2013-10-18 ENCOUNTER — Other Ambulatory Visit: Payer: Self-pay

## 2013-10-23 ENCOUNTER — Ambulatory Visit (INDEPENDENT_AMBULATORY_CARE_PROVIDER_SITE_OTHER): Payer: BC Managed Care – PPO | Admitting: Obstetrics and Gynecology

## 2013-10-23 ENCOUNTER — Encounter: Payer: Self-pay | Admitting: Obstetrics and Gynecology

## 2013-10-23 VITALS — BP 129/77 | HR 73 | Resp 16 | Ht 70.0 in | Wt 195.0 lb

## 2013-10-23 DIAGNOSIS — Z01812 Encounter for preprocedural laboratory examination: Secondary | ICD-10-CM

## 2013-10-23 DIAGNOSIS — Z30430 Encounter for insertion of intrauterine contraceptive device: Secondary | ICD-10-CM

## 2013-10-23 MED ORDER — LEVONORGESTREL 20 MCG/24HR IU IUD
1.0000 | INTRAUTERINE_SYSTEM | Freq: Once | INTRAUTERINE | Status: AC
  Administered 2013-10-23: 1 via INTRAUTERINE

## 2013-10-23 NOTE — Progress Notes (Signed)
Patient ID: Kylie Richards, female   DOB: Feb 27, 1991, 22 y.o.   MRN: [id number] 22 yo G1P1 s/p SVD in 07/2013 here for IUD insertion  IUD Procedure Note Patient identified, informed consent performed, signed copy in chart, time out was performed.  Urine pregnancy test negative.  Speculum placed in the vagina.  Cervix visualized.  Cleaned with Betadine x 2.  Grasped anteriorly with a single tooth tenaculum.  Uterus sounded to 8 cm.  Mirena IUD placed per manufacturer's recommendations.  Strings trimmed to 3 cm. Tenaculum was removed, good hemostasis noted.  Patient tolerated procedure well.   Patient given post procedure instructions and Mirena care card with expiration date.  Patient is asked to check IUD strings periodically and follow up in 4-6 weeks for IUD check.

## 2013-10-26 ENCOUNTER — Ambulatory Visit: Payer: BC Managed Care – PPO

## 2013-11-23 ENCOUNTER — Ambulatory Visit (INDEPENDENT_AMBULATORY_CARE_PROVIDER_SITE_OTHER): Payer: BC Managed Care – PPO | Admitting: Family

## 2013-11-23 ENCOUNTER — Encounter: Payer: Self-pay | Admitting: Family

## 2013-11-23 VITALS — BP 118/63 | HR 73 | Resp 16 | Ht 70.0 in | Wt 193.0 lb

## 2013-11-23 DIAGNOSIS — Z30431 Encounter for routine checking of intrauterine contraceptive device: Secondary | ICD-10-CM

## 2013-11-23 NOTE — Progress Notes (Signed)
   Subjective:    Patient ID: Shirlee Latch, female    DOB: Oct 13, 1991, 21 y.o.   MRN: [id number]  HPI  Aleeah is a 22 yo G1P1001 here for IUD string check.  Pt reports that spotting lasted 3 weeks, none at this time.  No issues during sexual intercourse.  Review of Systems    Pertinent info in HPI   Objective:   Physical Exam  Constitutional: She is oriented to person, place, and time. She appears well-developed and well-nourished.  HENT:  Head: Normocephalic.  Neck: Normal range of motion. Neck supple.  Abdominal: Soft. There is no tenderness.  Genitourinary: Cervix exhibits no motion tenderness, no discharge and no friability. Vaginal discharge found.  IUD string visualized at normal length; device not palpated at the os.  Neurological: She is alert and oriented to person, place, and time.  Skin: Skin is warm and dry.          Assessment & Plan:  Surveillance of IUD  Plan: Follow-up in March 2015 for Well Woman Exam.  [facility]

## 2014-03-08 ENCOUNTER — Ambulatory Visit: Payer: BC Managed Care – PPO | Admitting: Advanced Practice Midwife

## 2014-03-13 ENCOUNTER — Ambulatory Visit: Payer: BC Managed Care – PPO | Admitting: Advanced Practice Midwife

## 2014-04-05 ENCOUNTER — Encounter: Payer: Self-pay | Admitting: Family

## 2014-04-05 ENCOUNTER — Ambulatory Visit (INDEPENDENT_AMBULATORY_CARE_PROVIDER_SITE_OTHER): Payer: BC Managed Care – PPO | Admitting: Family

## 2014-04-05 VITALS — BP 126/74 | HR 90 | Resp 16 | Ht 70.0 in | Wt 183.0 lb

## 2014-04-05 DIAGNOSIS — Z113 Encounter for screening for infections with a predominantly sexual mode of transmission: Secondary | ICD-10-CM

## 2014-04-05 DIAGNOSIS — Z1151 Encounter for screening for human papillomavirus (HPV): Secondary | ICD-10-CM

## 2014-04-05 DIAGNOSIS — Z124 Encounter for screening for malignant neoplasm of cervix: Secondary | ICD-10-CM

## 2014-04-05 DIAGNOSIS — Z8759 Personal history of other complications of pregnancy, childbirth and the puerperium: Secondary | ICD-10-CM

## 2014-04-05 DIAGNOSIS — Z01419 Encounter for gynecological examination (general) (routine) without abnormal findings: Secondary | ICD-10-CM

## 2014-04-05 NOTE — Progress Notes (Signed)
  Subjective:     Kylie Richards is a [age] y.o. female here for a routine exam.  Current complaints: desires STD screening.  Recently discovered father of child was cheating on her during her pregnancy.  No longer in a relationship with him.  Personal health questionnaire reviewed: yes.   Gynecologic History Patient's last menstrual period was 03/22/2014. Contraception: IUD Last Pap: March 2014. Results were: normal   Obstetric History OB History  Gravida Para Term Preterm AB SAB TAB Ectopic Multiple Living  1 1 1  0 0 0 0 0 0 1    # Outcome Date GA Lbr Len/2nd Weight Sex Delivery Anes PTL Lv  1 TRM 08/10/13 [id number] 16:45 / 03:59 9 lb 6.4 oz (4.265 kg) M SVD EPI  Y      Review of Systems Pertinent items are noted in HPI.    Objective:   BP 126/74  Pulse 90  Resp 16  Ht 5\' 10"  (1.778 m)  Wt 183 lb (83.008 kg)  BMI 26.26 kg/m2  LMP 03/22/2014  Breastfeeding? No General appearance: alert, cooperative and appears stated age Head: Normocephalic, without obvious abnormality, atraumatic Neck: no adenopathy, no carotid bruit, no JVD, supple, symmetrical, trachea midline and thyroid not enlarged, symmetric, no tenderness/mass/nodules Lungs: clear to auscultation bilaterally Breasts: normal appearance, no masses or tenderness, No nipple retraction or dimpling, No nipple discharge or bleeding, No axillary or supraclavicular adenopathy, Normal to palpation without dominant masses, Taught monthly breast self examination Heart: regular rate and rhythm, S1, S2 normal, no murmur, click, rub or gallop Abdomen: soft, non-tender; bowel sounds normal; no masses,  no organomegaly Pelvic: cervix normal in appearance, increased mucus discharge, IUD strings present and visualized; external genitalia normal, no adnexal masses or tenderness, no cervical motion tenderness, rectovaginal septum normal, uterus normal size, shape, and consistency and vagina normal without discharge Skin: Skin color, texture,  turgor normal. No rashes or lesions     Assessment:    Healthy female exam STD Screen.    Plan:    Contraception: IUD HSV I&II, RPR, GC/CT and wet prep collected  Follow-up prn  Walidah Paul HalfN Muhammad, CNM

## 2014-04-06 LAB — HIV ANTIBODY (ROUTINE TESTING W REFLEX): HIV 1&2 Ab, 4th Generation: NONREACTIVE

## 2014-04-06 LAB — RPR

## 2014-04-08 ENCOUNTER — Telehealth: Payer: Self-pay | Admitting: *Deleted

## 2014-04-08 NOTE — Telephone Encounter (Signed)
LM on voicemail of neg labs. 

## 2014-04-10 LAB — CERVICOVAGINAL ANCILLARY ONLY
Bacterial vaginitis: POSITIVE — AB
Bacterial vaginitis: POSITIVE — AB
Candida vaginitis: NEGATIVE

## 2014-04-13 ENCOUNTER — Other Ambulatory Visit: Payer: Self-pay | Admitting: Family

## 2014-04-13 MED ORDER — METRONIDAZOLE 500 MG PO TABS: 500.0000 mg | ORAL_TABLET | Freq: Two times a day (BID) | ORAL | Status: DC

## 2014-04-15 ENCOUNTER — Telehealth: Payer: Self-pay | Admitting: *Deleted

## 2014-04-15 NOTE — Telephone Encounter (Signed)
Call[facility] pt to adv med[facility]

## 2014-04-15 NOTE — Telephone Encounter (Signed)
Message copied by Arne ClevelandHUTCHINSON, Philisha Weinel J on Mon Apr 15, 2014  8:16 AM ------      Message from: Melissa NoonMUHAMMAD, WALIDAH N      Created: Sat Apr 13, 2014  4:57 PM      Regarding: Bacterial Vaginosis Meds       Can you please let her know that I sent a RX for BV to pharmacy?                  ----- Message -----         From: [facility]         Sent: 04/06/2014   1:16 AM           To: Melissa NoonWalidah N Muhammad, CNM                   ------

## 2014-08-20 IMAGING — US US OB COMP +14 WK
2 series · 12 of 28 positions shown · non-contrast
Comparison: none

[Series 1: us ob comp +14 wk · 72 acquisitions, 10 frames shown (1 of 2)]
[im 4/72]
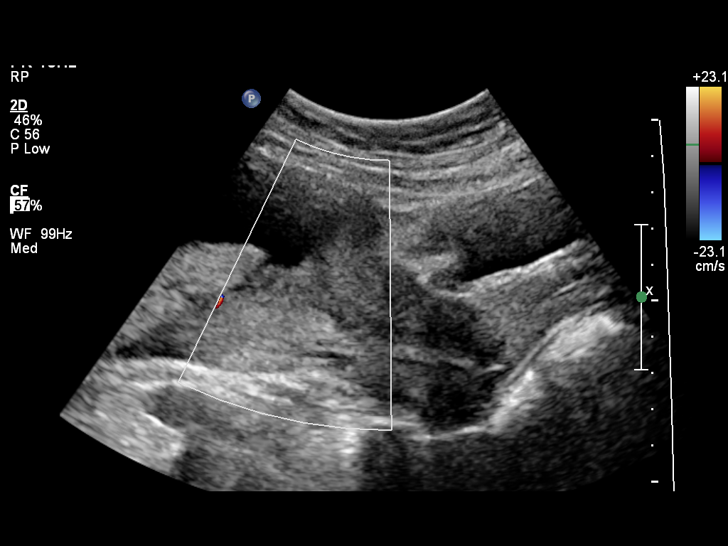
[im 10/72]
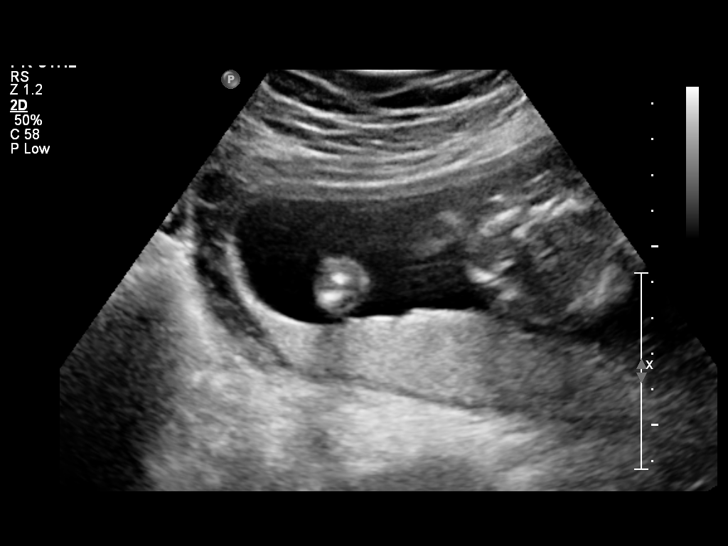
[im 16/72]
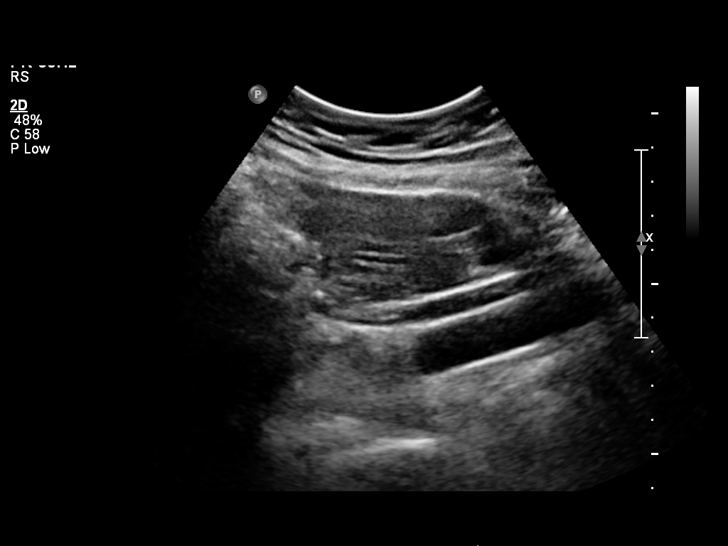
[im 25/72]
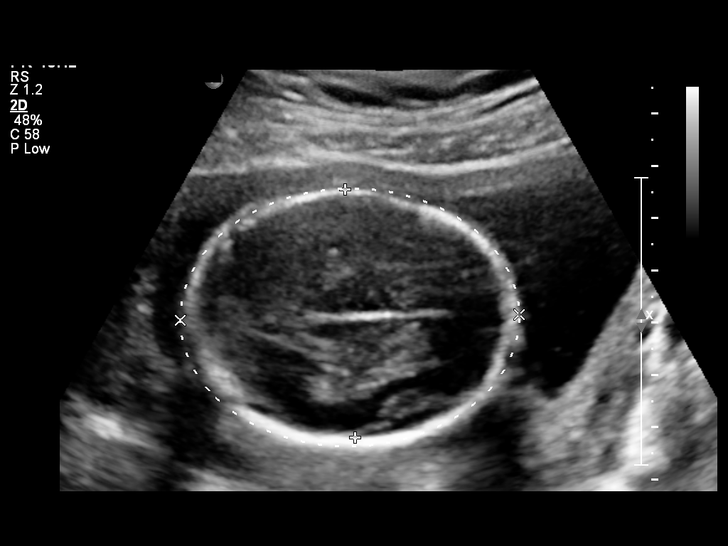
[im 31/72]
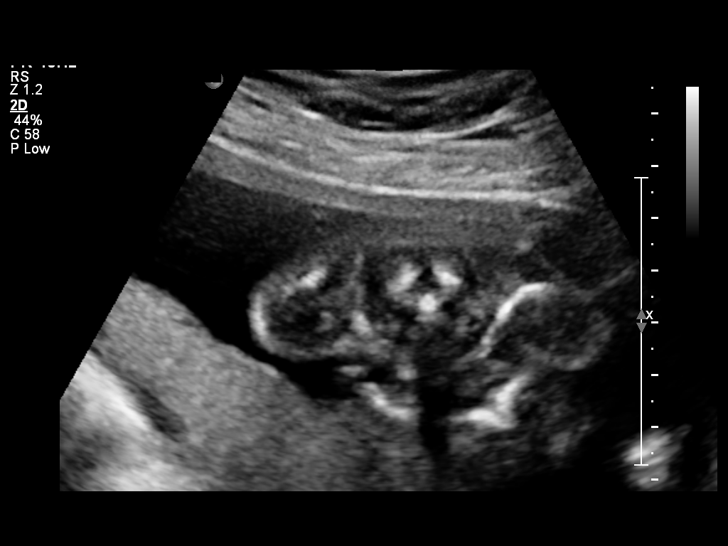
[im 38/72]
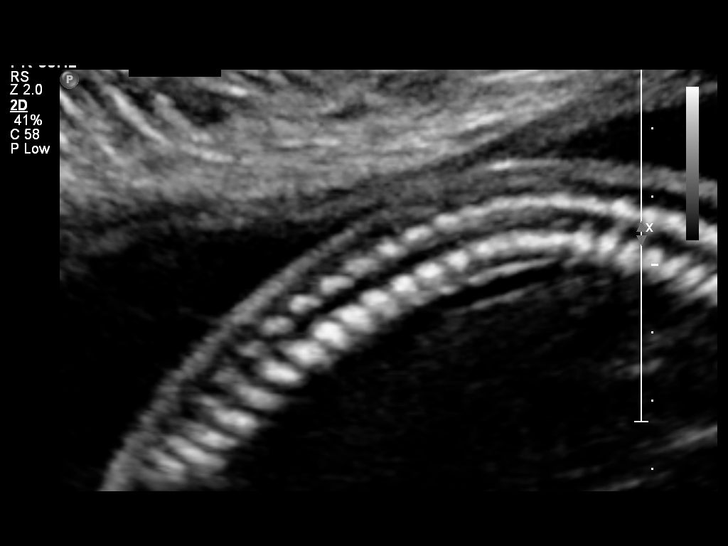
[im 47/72]
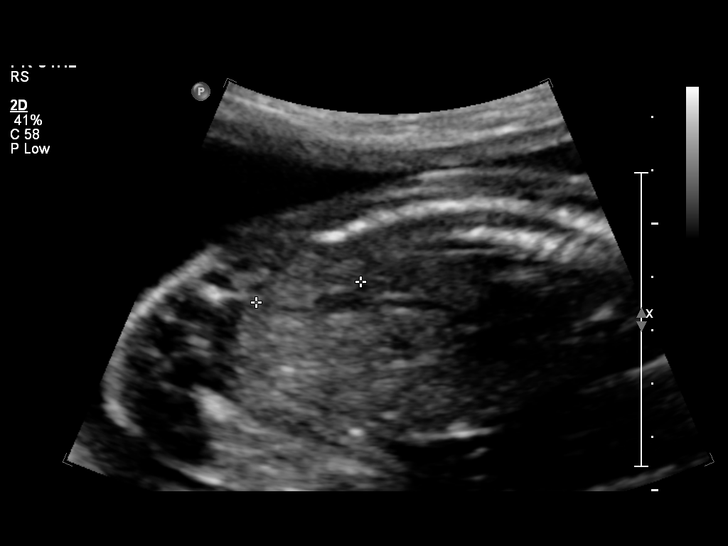
[im 53/72]
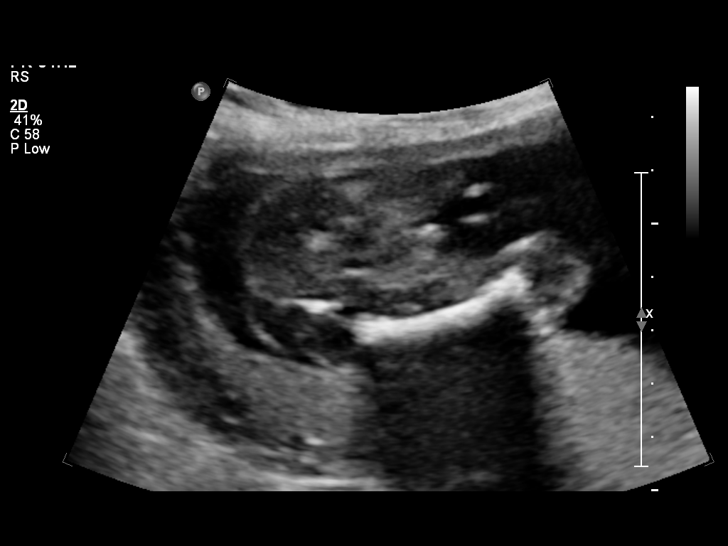
[im 59/72]
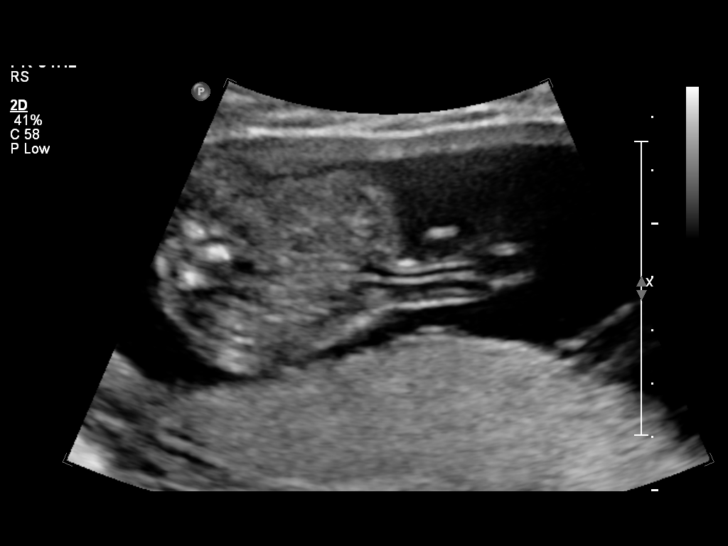
[im 68/72]
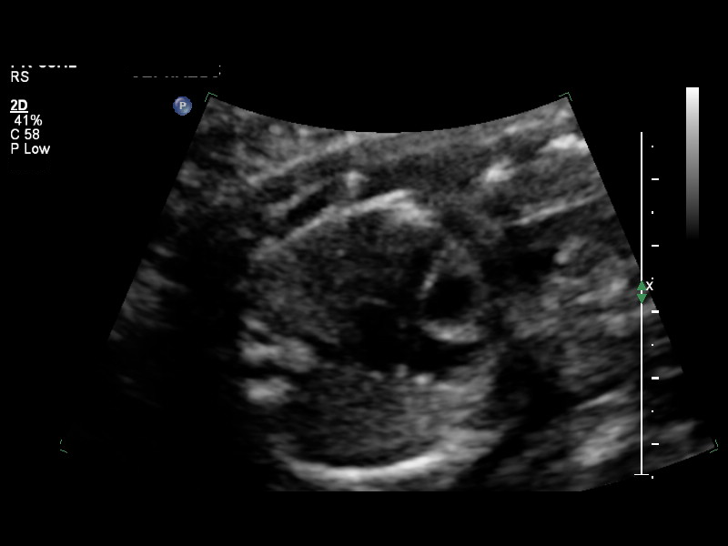

[Series 1: us ob comp +14 wk · 2 of 13 slices shown (2 of 2)]
[im 1/13]
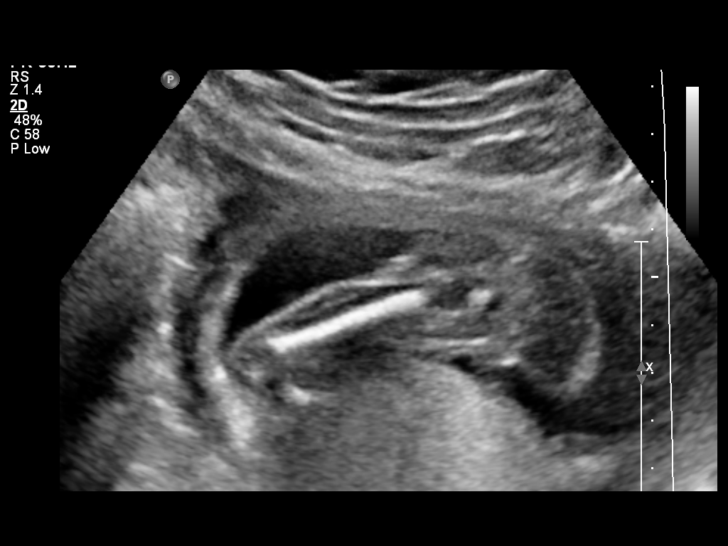
[im 9/13]
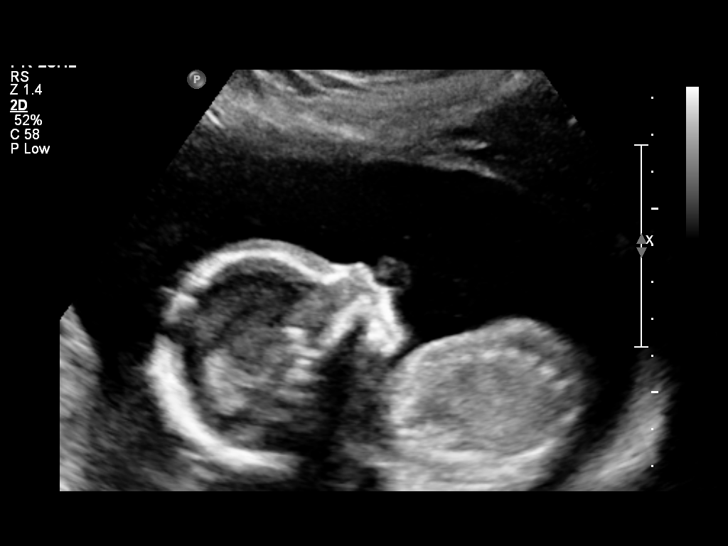

[12 of 28 positions shown; findings below may reference images not displayed]

OBSTETRICS REPORT
                      (Signed Final 03/30/2013 [DATE])

Service(s) Provided

 US OB COMP + 14 WK                                    76805.1
Indications

 Basic anatomic survey
Fetal Evaluation

 Num Of Fetuses:    1
 Fetal Heart Rate:  142                         bpm
 Cardiac Activity:  Observed
 Presentation:      Cephalic
 Placenta:          Low-lying, .8 cm from int
                    os
 P. Cord            Visualized
 Insertion:

 Amniotic Fluid
 AFI FV:      Subjectively within normal limits
                                             Larg Pckt:     4.1  cm
Biometry

 BPD:     47.9  mm    G. Age:   20w 3d                CI:        71.35   70 - 86
                                                      FL/HC:      19.0   16.8 -

 HC:     180.6  mm    G. Age:   20w 3d       77  %    HC/AC:      1.11   1.09 -

 AC:     163.3  mm    G. Age:   21w 3d       90  %    FL/BPD:
 FL:      34.4  mm    G. Age:   20w 6d       80  %    FL/AC:      21.1   20 - 24
 HUM:     31.4  mm    G. Age:   20w 3d       72  %
 CER:     20.1  mm    G. Age:   19w 1d       38  %
 NFT:     5.57  mm

 Est. FW:     395  gm    0 lb 14 oz      66  %
Gestational Age

 LMP:           19w 5d       Date:   11/12/12                 EDD:   08/19/13
 Clinical EDD:  19w 5d                                        EDD:   08/19/13
 U/S Today:     20w 6d                                        EDD:   08/11/13
 Best:          19w 5d    Det. By:   Clinical EDD             EDD:   08/19/13
Anatomy
 Cranium:          Appears normal         Aortic Arch:      Basic anatomy
                                                            exam per order
 Fetal Cavum:      Appears normal         Ductal Arch:      Basic anatomy
                                                            exam per order
 Ventricles:       Appears normal         Diaphragm:        Appears normal
 Choroid Plexus:   Appears normal         Stomach:          Appears normal, left
                                                            sided
 Cerebellum:       Appears normal         Abdomen:          Appears normal
 Posterior Fossa:  Appears normal         Abdominal Wall:   Appears nml (cord
                                                            insert, abd wall)
 Nuchal Fold:      Appears normal         Cord Vessels:     Appears normal (3
                                                            vessel cord)
 Face:             Profile appears        Kidneys:          Appear normal
                   normal
 Lips:             Not well visualized    Bladder:          Appears normal
 Heart:            Appears normal         Spine:            Appears normal
                   (4CH, axis, and
                   situs)
 RVOT:             Not well visualized    Lower             Appears normal
                                          Extremities:
 LVOT:             Appears normal         Upper             Appears normal
                                          Extremities:

 Other:  Fetus appears to be a male. Technically difficult due to fetal position.
Targeted Anatomy

 Fetal Central Nervous System
 Lat. Ventricles:  6.5                    Cisterna Magna:
Cervix Uterus Adnexa

 Cervical Length:   3.6       cm

 Cervix:       Normal appearance by transabdominal scan.
 Cul De Sac:   No free fluid seen.
 Left Ovary:   Not visualized.
 Right Ovary:  Not visualized.
 Adnexa:     No abnormality visualized.
Impression

 Single intrauterine gestation demonstrating an estimated
 gestational age by ultrasound of 20w 6d. This is correlated
 with expected estimated gestational age by clinical EDD of
 19w 5d.

 Visualized fetal anatomy appears normal.

 Current low lying placental position. Follow up is
 recommended at > 28 weeks for initial reassessment.

 Subjectively and quantitatively normal amniotic fluid volume.

 Normal cervical length and appearance.

## 2014-09-19 ENCOUNTER — Ambulatory Visit: Payer: BC Managed Care – PPO | Admitting: Obstetrics & Gynecology

## 2014-10-14 ENCOUNTER — Encounter: Payer: Self-pay | Admitting: Family

## 2014-10-20 IMAGING — US US OB FOLLOW-UP
1 series · 12 of 28 positions shown · non-contrast
Comparison: none

[Series 1: us ob follow up · 12 of 51 slices shown]
[im 2/51]
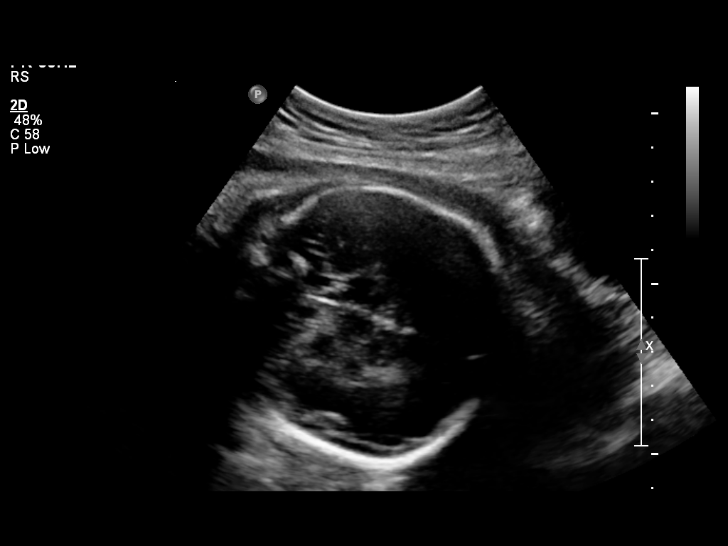
[im 6/51]
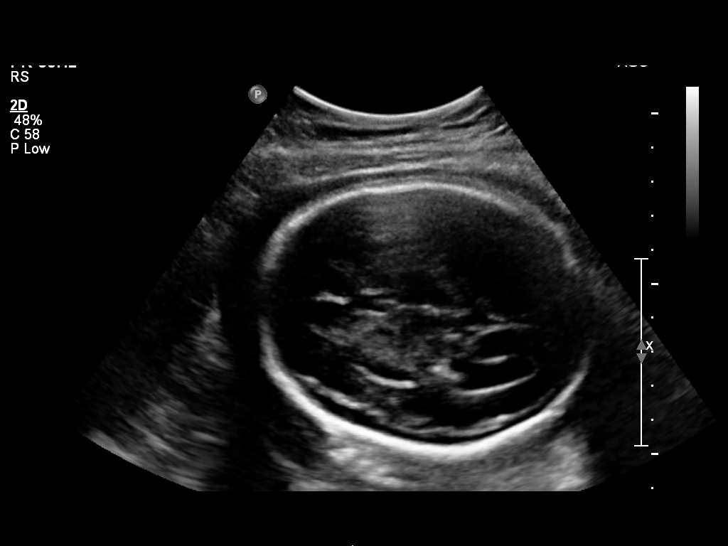
[im 10/51]
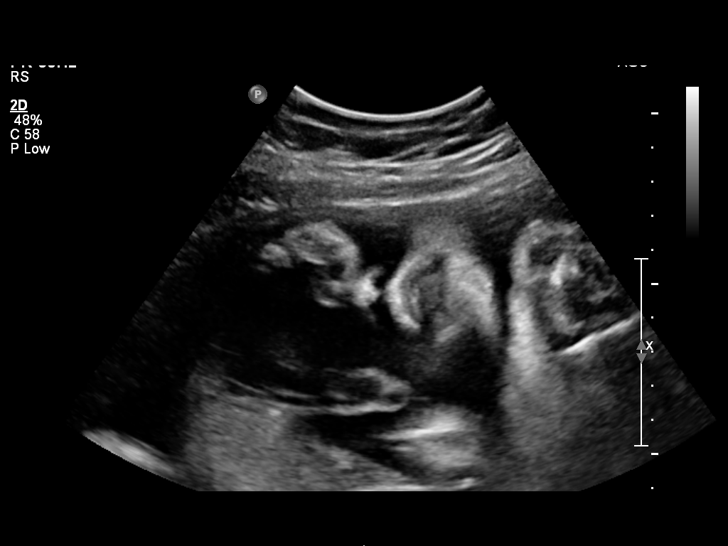
[im 15/51]
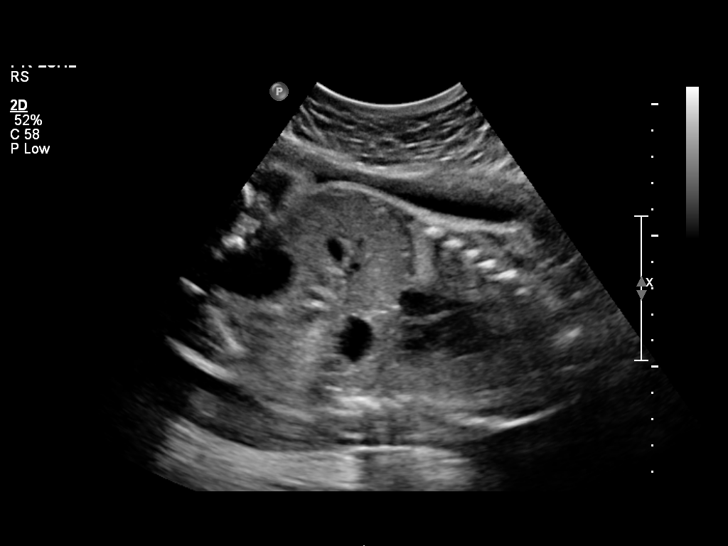
[im 19/51]
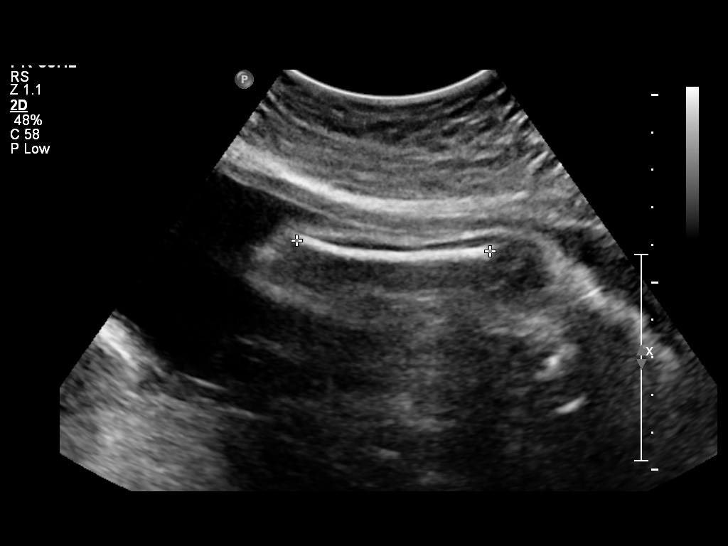
[im 23/51]
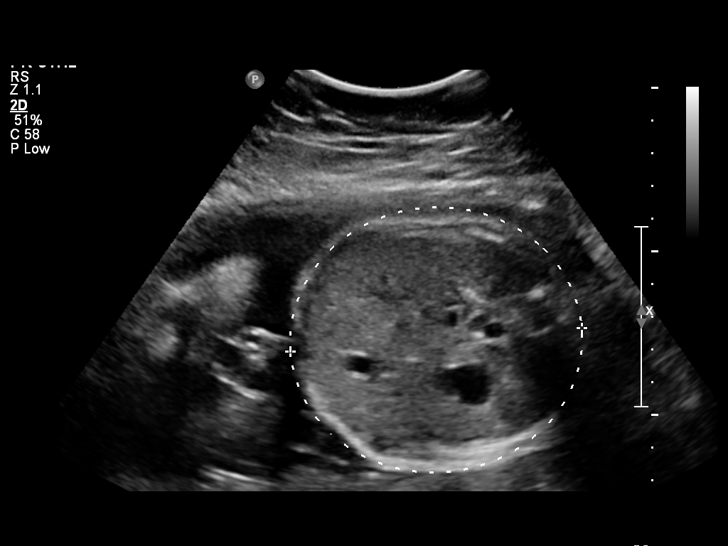
[im 28/51]
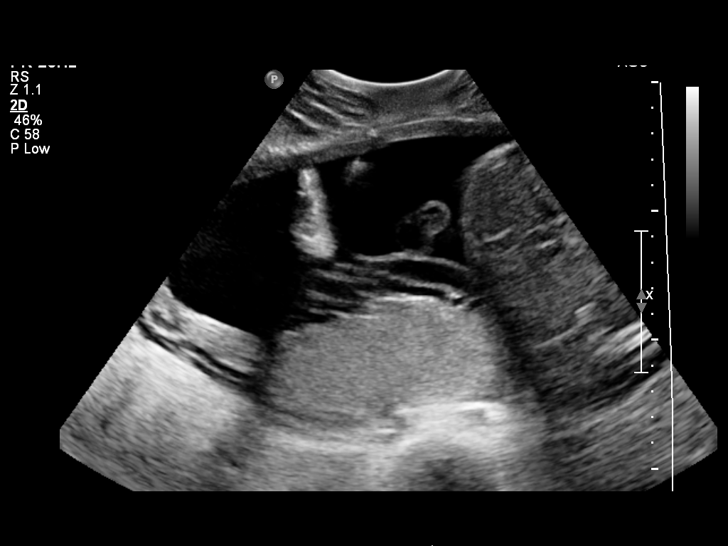
[im 32/51]
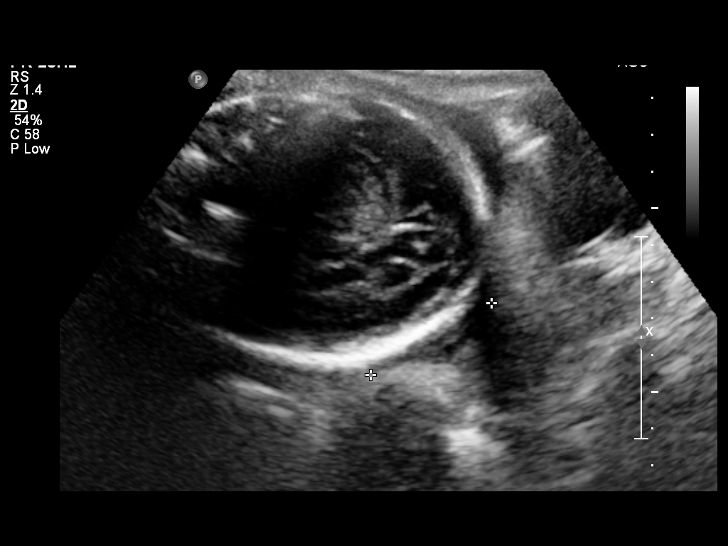
[im 36/51]
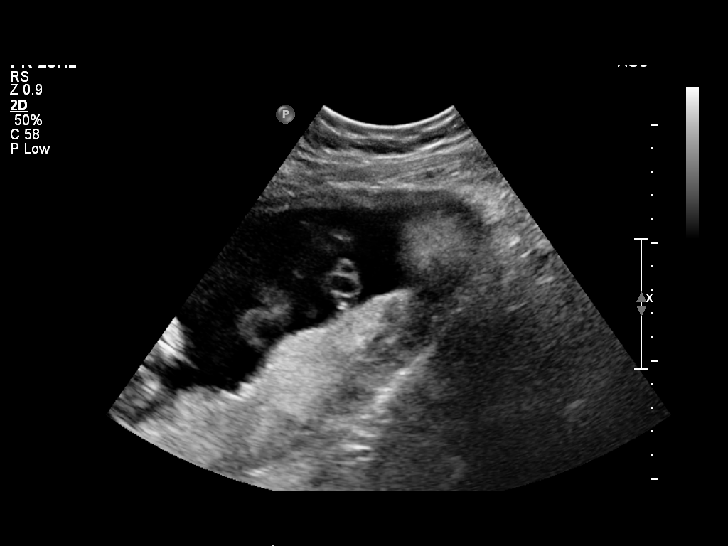
[im 41/51]
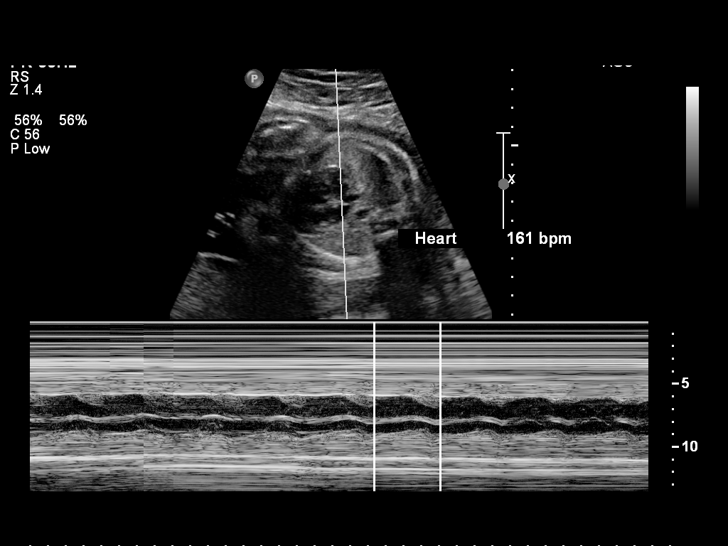
[im 45/51]
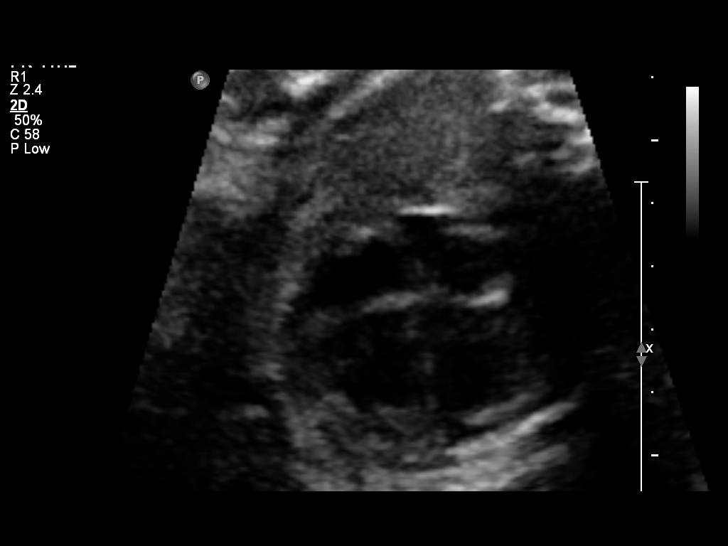
[im 49/51]
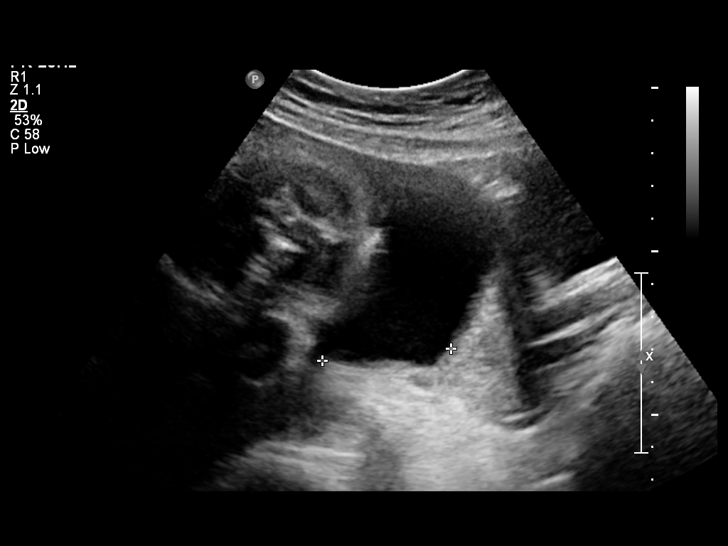

[12 of 28 positions shown; findings below may reference images not displayed]

OBSTETRICS REPORT
                      (Signed Final 05/30/2013 [DATE])

Service(s) Provided

 US OB FOLLOW UP                                       76816.1
Indications

 Placenta previa/Low lying: No bleeding
Fetal Evaluation

 Num Of Fetuses:    1
 Fetal Heart Rate:  161                         bpm
 Cardiac Activity:  Observed
 Presentation:      Cephalic
 Placenta:          Posterior, above cervical
                    os
 P. Cord            Visualized, central
 Insertion:

 Amniotic Fluid
 AFI FV:      Subjectively within normal limits
 AFI Sum:     16.62   cm      61   %Tile     Larg Pckt:   5.76   cm
 RUQ:   5.76   cm    RLQ:    3.06   cm    LUQ:   3.63    cm   LLQ:    4.17   cm
Biometry

 BPD:     76.9  mm    G. Age:   30w 6d                CI:        73.19   70 - 86
                                                      FL/HC:      20.0   18.8 -

 HC:     285.7  mm    G. Age:   31w 3d       93  %    HC/AC:      1.08   1.05 -

 AC:     265.1  mm    G. Age:   30w 4d       93  %    FL/BPD:     74.3   71 - 87
 FL:      57.1  mm    G. Age:   30w 0d       77  %    FL/AC:      21.5   20 - 24
 HUM:       51  mm    G. Age:   29w 6d       74  %

 Est. FW:    6959  gm      3 lb 8 oz     82  %
Gestational Age

 LMP:           28w 3d       Date:   11/12/12                 EDD:   08/19/13
 Clinical EDD:  28w 3d                                        EDD:   08/19/13
 U/S Today:     30w 5d                                        EDD:   08/03/13
 Best:          28w 3d    Det. By:   Clinical EDD             EDD:   08/19/13
Anatomy

 Cranium:          Appears normal         Aortic Arch:      Basic anatomy
                                                            exam per order
 Fetal Cavum:      Appears normal         Ductal Arch:      Basic anatomy
                                                            exam per order
 Ventricles:       Appears normal         Diaphragm:        Appears normal
 Choroid Plexus:   Previously seen        Stomach:          Appears normal, left
                                                            sided
 Cerebellum:       Previously seen        Abdomen:          Appears normal
 Posterior Fossa:  Previously seen        Abdominal Wall:   Previously seen
 Nuchal Fold:      Previously seen        Cord Vessels:     Appears normal (3
                                                            vessel cord)
 Face:             Appears normal         Kidneys:          Appear normal
                   (orbits and profile)
 Lips:             Appears normal         Bladder:          Appears normal
 Heart:            Appears normal         Spine:            Previously seen
                   (4CH, axis, and
                   situs)
 RVOT:             Appears normal         Lower             Previously seen
                                          Extremities:
 LVOT:             Appears normal         Upper             Previously seen
                                          Extremities:

 Other:  Fetus previosuly visualized as male.
Cervix Uterus Adnexa

 Cervical Length:   3.62      cm

 Cervix:       Normal appearance by transabdominal scan.
 Uterus:       No abnormality visualized.
 Cul De Sac:   No free fluid seen.
 Left Ovary:   Not visualized.
 Right Ovary:  Not visualized.
 Adnexa:     No abnormality visualized.
Impression

 Single living IUP with assigned GA of 28w 3 in cephalic
 position. Appropriate fetal growth.
 The placenta is posterior and above the cervical os.
 Normal visualized fetal anatomy.
 Normal amniotic fluid volume and cervical length.

## 2015-01-30 ENCOUNTER — Ambulatory Visit (INDEPENDENT_AMBULATORY_CARE_PROVIDER_SITE_OTHER): Payer: BLUE CROSS/BLUE SHIELD | Admitting: Obstetrics & Gynecology

## 2015-01-30 ENCOUNTER — Encounter: Payer: Self-pay | Admitting: Obstetrics & Gynecology

## 2015-01-30 VITALS — BP 121/80 | HR 90 | Resp 16 | Ht 70.0 in | Wt 195.0 lb

## 2015-01-30 DIAGNOSIS — Z30431 Encounter for routine checking of intrauterine contraceptive device: Secondary | ICD-10-CM

## 2015-01-30 DIAGNOSIS — N949 Unspecified condition associated with female genital organs and menstrual cycle: Secondary | ICD-10-CM

## 2015-01-30 DIAGNOSIS — R102 Pelvic and perineal pain: Secondary | ICD-10-CM

## 2015-01-30 NOTE — Progress Notes (Signed)
   Subjective:    Patient ID: Kylie Richards, female    DOB: 02/13/1991, 24 y.o.   MRN: [id number]  HPI  This 24 yo SW P1 has had her Kylie Richards in since 2014. She has had only 2 periods in that time, one of which was last month. She has had unusual pelvic cramping and wants to make sure that her Kylie Richards is still in place. She is monogamous and says that she has been tested for STIs while with this partner.  Review of Systems     Objective:   Physical Exam [id number] Breathing normally Neuro intact Cervix and vaginal discharge appears normal  IUD string seen, about 3 cm long       Assessment & Plan:  Cramping, new onset Reassurance given Rec IBU. If no help, then schedule u/s to confirm exact placement of Kylie Richards Rec MVI daily
